# Patient Record
Sex: Male | Born: 1970 | Race: White | Hispanic: No | Marital: Married | Smoking: Former smoker
Health system: Southern US, Community
[De-identification: ages and names within clinical notes are randomized; demographics above are authoritative.]

## PROBLEM LIST (undated history)

## (undated) DIAGNOSIS — E785 Hyperlipidemia, unspecified: Secondary | ICD-10-CM

## (undated) DIAGNOSIS — R51 Headache: Secondary | ICD-10-CM

## (undated) DIAGNOSIS — K649 Unspecified hemorrhoids: Secondary | ICD-10-CM

## (undated) DIAGNOSIS — R7989 Other specified abnormal findings of blood chemistry: Secondary | ICD-10-CM

## (undated) DIAGNOSIS — R519 Headache, unspecified: Secondary | ICD-10-CM

## (undated) DIAGNOSIS — I10 Essential (primary) hypertension: Secondary | ICD-10-CM

## (undated) DIAGNOSIS — E039 Hypothyroidism, unspecified: Secondary | ICD-10-CM

## (undated) DIAGNOSIS — J189 Pneumonia, unspecified organism: Secondary | ICD-10-CM

## (undated) DIAGNOSIS — R011 Cardiac murmur, unspecified: Secondary | ICD-10-CM

## (undated) HISTORY — DX: Other specified abnormal findings of blood chemistry: R79.89

## (undated) HISTORY — DX: Hypothyroidism, unspecified: E03.9

## (undated) HISTORY — PX: OTHER SURGICAL HISTORY: SHX169

## (undated) HISTORY — PX: HERNIA REPAIR: SHX51

## (undated) HISTORY — DX: Hyperlipidemia, unspecified: E78.5

## (undated) HISTORY — PX: MOUTH SURGERY: SHX715

---

## 2015-01-04 ENCOUNTER — Ambulatory Visit: Payer: Self-pay | Admitting: Cardiovascular Disease

## 2015-01-24 NOTE — Progress Notes (Signed)
Patient ID: Tanner Harrison, male   DOB: February 12, 1971, 44 y.o.   MRN: [id number]    Cardiology Office Note   Date:  01/25/2015   ID:  Tanner Harrison, DOB 05/14/1971, MRN [id number]  PCP:  Antony Blackbird, MD  Cardiologist:   Jenkins Rouge, MD   No chief complaint on file.     History of Present Illness: Tanner Harrison is a 44 y.o. male who presents for evaluation family history of premature CAD. Father with MI in late 18's and mom with heart valve replacement in 60's   Referred by Tanner Harrison  He takes a baby aspirin daily. On thyroid replacement and low testosterone. Fatigue No chest pain palpitations.  Recent weight gain.  20lbs.  On lots of supplements and DHEA from a Dr Tanner Harrison in [place].  Sedentary since started at [place] year was able to run 1/2 marathon.  Has been on statin since age 81  Dose recently decreased from 20 mg to 5 mg concern about long term side effects.  Non smoker , no DM or HTN   Labs reviewed from 8/15  Normal LFTls K 4.6 Testosterone 1.85 LDL 85 HDL 44  TSH 1.55     No past medical history on file.  No past surgical history on file.   Current Outpatient Prescriptions  Medication Sig Dispense Refill  . albuterol (PROVENTIL HFA;VENTOLIN HFA) 108 (90 BASE) MCG/ACT inhaler Inhale 2 puffs into the lungs every 6 (six) hours as needed for wheezing or shortness of breath.    Marland Kitchen aspirin 81 MG tablet Take 81 mg by mouth daily.    . cholecalciferol (VITAMIN D) 1000 UNITS tablet Take 5,000 Units by mouth daily.    . fluticasone (FLONASE) 50 MCG/ACT nasal spray Place 2 sprays into both nostrils daily as needed. allergies  12  . levothyroxine (SYNTHROID, LEVOTHROID) 112 MCG tablet Take 112 mcg by mouth daily.  0  . montelukast (SINGULAIR) 10 MG tablet Take 10 mg by mouth daily.  1  . simvastatin (ZOCOR) 5 MG tablet Take 5 mg by mouth daily.  0   No current facility-administered medications for this visit.    Allergies:   Review of patient's allergies indicates no known  allergies.    Social History:  The patient  reports that he has quit smoking. He does not have any smokeless tobacco history on file. He reports that he does not drink alcohol or use illicit drugs.   Family History:  SEE HPI  Premature CAD    ROS:  Please see the history of present illness.   Otherwise, review of systems are positive for none.   All other systems are reviewed and negative.    PHYSICAL EXAM: VS:  BP 158/100 mmHg  Pulse 65  Ht 5\' 5"  (1.651 m)  Wt 102.114 kg (225 lb 1.9 oz)  BMI 37.46 kg/m2 , BMI Body mass index is 37.46 kg/(m^2). GEN: Well nourished, well developed, in no acute distress HEENT: normal Neck: no JVD, carotid bruits, or masses Cardiac: RRR; no murmurs, rubs, or gallops,no edema  Respiratory:  clear to auscultation bilaterally, normal work of breathing GI: soft, nontender, nondistended, + BS MS: no deformity or atrophy Skin: warm and dry, no rash Neuro:  Strength and sensation are intact Psych: euthymic mood, full affect   EKG:   01/25/15  NSR normal no change from ECG from [facility] done 6/13   Recent Labs: No results found for requested labs within last 365 days.    Lipid  Panel No results found for: CHOL, TRIG, HDL, CHOLHDL, VLDL, LDLCALC, LDLDIRECT    Wt Readings from Last 3 Encounters:  01/25/15 102.114 kg (225 lb 1.9 oz)      Other studies Reviewed: Additional studies/ records that were reviewed today include: [facility] / epic notes and lab work with old ECG .    ASSESSMENT AND PLAN:  1.  Family history of heart disease:  No symptoms normal ECG Does not need ETT.  Recommended coronary calcium score to further risk stratify and see what  Intensity of statin Rx may be needed.  Continue ASA.  F/U in a year 2. Hypothyroid:   TSH normal on replacement 3> Low T  Concern for side effects including hypercoagulability  ASA F/U Augoustides 4. Chol:  On low dose statin if calcium score 0 consider changing to qod  LFTls normal Chronic use may be  contributing to fatigue    Current medicines are reviewed at length with the patient today.  The patient has concerns regarding statin Addressed   The following changes have been made:  no change  Labs/ tests ordered today include: Coronary calcium Score  Orders Placed This Encounter  Procedures  . CT Cardiac Scoring  . EKG 12-Lead     Disposition:   FU with me  in 1 year   Signed, Jenkins Rouge, MD  01/25/2015 5:20 PM    [facility] [place], [place], [place]  [address] Phone: [phone]; Fax: [phone]

## 2015-01-25 ENCOUNTER — Ambulatory Visit (INDEPENDENT_AMBULATORY_CARE_PROVIDER_SITE_OTHER): Payer: BLUE CROSS/BLUE SHIELD | Admitting: Cardiovascular Disease

## 2015-01-25 ENCOUNTER — Encounter: Payer: Self-pay | Admitting: Cardiovascular Disease

## 2015-01-25 VITALS — BP 158/100 | HR 65 | Ht 65.0 in | Wt 225.1 lb

## 2015-01-25 DIAGNOSIS — Z8249 Family history of ischemic heart disease and other diseases of the circulatory system: Secondary | ICD-10-CM

## 2015-01-25 NOTE — Patient Instructions (Signed)
Your physician wants you to follow-up in:   [place] will receive a reminder letter in the mail two months in advance. If you don't receive a letter, please call our office to schedule the follow-up appointment. Your physician recommends that you continue on your current medications as directed. Please refer to the Current Medication list given to you today.    CALCIUM  SCORE   OUT OF POCKET   $150 .00

## 2015-03-09 ENCOUNTER — Ambulatory Visit (INDEPENDENT_AMBULATORY_CARE_PROVIDER_SITE_OTHER)
Admission: RE | Admit: 2015-03-09 | Discharge: 2015-03-09 | Disposition: A | Discharge status: Home / Self Care | Payer: Self-pay | Source: Ambulatory Visit | Attending: Cardiovascular Disease | Admitting: Cardiovascular Disease

## 2015-03-09 DIAGNOSIS — Z8249 Family history of ischemic heart disease and other diseases of the circulatory system: Secondary | ICD-10-CM

## 2016-02-12 DIAGNOSIS — E039 Hypothyroidism, unspecified: Secondary | ICD-10-CM | POA: Insufficient documentation

## 2016-02-12 DIAGNOSIS — R7989 Other specified abnormal findings of blood chemistry: Secondary | ICD-10-CM | POA: Insufficient documentation

## 2016-02-12 DIAGNOSIS — E785 Hyperlipidemia, unspecified: Secondary | ICD-10-CM | POA: Insufficient documentation

## 2016-02-12 NOTE — Progress Notes (Signed)
Patient ID: Tanner Harrison, male   DOB: Mar 17, 1971, 45 y.o.   MRN: [id number]    Cardiology Office Note   Date:  02/12/2016   ID:  Tanner Harrison, DOB 1971/12/03, MRN [id number]  PCP:  Antony Blackbird, MD  Cardiologist:   Jenkins Rouge, MD   No chief complaint on file.     History of Present Illness: Tanner Harrison is a 45 y.o. male who presents for evaluation family history of premature CAD. Father with MI in late 68's and mom with heart valve replacement in 60's   Referred by Cammie Fulpe  He takes a baby aspirin daily. On thyroid replacement and low testosterone. Fatigue No chest pain palpitations.  Recent weight gain.  20lbs.  On lots of supplements and DHEA from a Dr Judeen Hammans in [place].  Sedentary since started at [place] year was able to run 1/2 marathon.  Has been on statin since age 36  Dose recently decreased from 20 mg to 5 mg concern about long term side effects.  Non smoker , no DM or HTN   Labs reviewed from 8/15  Normal LFTls K 4.6 Testosterone 1.85 LDL 85 HDL 44  TSH 1.55   03/09/15  Calcium score was 0    Past Medical History  Diagnosis Date  . Hypothyroid   . Low TSH level   . Hyperlipidemia     No past surgical history on file.   Current Outpatient Prescriptions  Medication Sig Dispense Refill  . albuterol (PROVENTIL HFA;VENTOLIN HFA) 108 (90 BASE) MCG/ACT inhaler Inhale 2 puffs into the lungs every 6 (six) hours as needed for wheezing or shortness of breath.    Marland Kitchen aspirin 81 MG tablet Take 81 mg by mouth daily.    . cholecalciferol (VITAMIN D) 1000 UNITS tablet Take 5,000 Units by mouth daily.    . fluticasone (FLONASE) 50 MCG/ACT nasal spray Place 2 sprays into both nostrils daily as needed. allergies  12  . levothyroxine (SYNTHROID, LEVOTHROID) 112 MCG tablet Take 112 mcg by mouth daily.  0  . montelukast (SINGULAIR) 10 MG tablet Take 10 mg by mouth daily.  1  . simvastatin (ZOCOR) 5 MG tablet Take 5 mg by mouth daily.  0   No current facility-administered  medications for this visit.    Allergies:   Review of patient's allergies indicates no known allergies.    Social History:  The patient  reports that he has quit smoking. He does not have any smokeless tobacco history on file. He reports that he does not drink alcohol or use illicit drugs.   Family History:  SEE HPI  Premature CAD    ROS:  Please see the history of present illness.   Otherwise, review of systems are positive for none.   All other systems are reviewed and negative.    PHYSICAL EXAM: VS:  There were no vitals taken for this visit. , BMI There is no weight on file to calculate BMI. GEN: Well nourished, well developed, in no acute distress HEENT: normal Neck: no JVD, carotid bruits, or masses Cardiac: RRR; no murmurs, rubs, or gallops,no edema  Respiratory:  clear to auscultation bilaterally, normal work of breathing GI: soft, nontender, nondistended, + BS MS: no deformity or atrophy Skin: warm and dry, no rash Neuro:  Strength and sensation are intact Psych: euthymic mood, full affect   EKG:   01/25/15  NSR normal no change from ECG from [facility] done 6/13   Recent Labs: No results found for  requested labs within last 365 days.    Lipid Panel No results found for: CHOL, TRIG, HDL, CHOLHDL, VLDL, LDLCALC, LDLDIRECT    Wt Readings from Last 3 Encounters:  01/25/15 102.114 kg (225 lb 1.9 oz)      Other studies Reviewed: Additional studies/ records that were reviewed today include: [facility] / epic notes and lab work with old ECG .    ASSESSMENT AND PLAN:  1.  Family history of heart disease:  No symptoms normal ECG Does not need ETT.  Recommended coronary calcium score to further risk stratify and see what  Intensity of statin Rx may be needed.  Continue ASA.  F/U in a year 2. Hypothyroid:   TSH normal on replacement 3> Low T  Concern for side effects including hypercoagulability  ASA F/U Augoustides 4. Chol:  On low dose statin calcium score 0    Current  medicines are reviewed at length with the patient today.  The patient has concerns regarding statin Addressed   The following changes have been made:  no change  Labs/ tests ordered today include: Coronary calcium Score  No orders of the defined types were placed in this encounter.     Disposition:   FU with me  in 1 year   Signed, Jenkins Rouge, MD  02/12/2016 4:24 PM    [facility] [place], [place], [place]  [address] Phone: [phone]; Fax: [phone]

## 2016-02-19 NOTE — Progress Notes (Signed)
Cardiology Office Note:    Date:  02/20/2016   ID:  Tanner Harrison, DOB 1971/07/23, MRN [id number]  PCP:  Karle Starch, MD  Cardiologist:  Dr. Jenkins Rouge   Electrophysiologist:  n/a  Chief Complaint  Patient presents with  . FHx of CAD    History of Present Illness:     Tanner Harrison is a 45 y.o. male with a hx of family history of premature CAD (father with MI late 81s, mom with heart valve replacement in her 109s), hypothyroidism, dyslipidemia. He was evaluated by Dr. Johnsie Cancel in 2/16. Cardiac CT demonstrated calcium score 0.   Returns for FU. Doing well. He tries to run 1-2 times a week.  PCP told him his "salt was low."  He has added salt to his diet. His BP is high today.  His BP at other MD visits has been 120/80s.  He denies chest pain, dyspnea, syncope, orthopnea, PND, edema.  He snores.  His wife has not noticed any apnea.  He denies daytime hypersomnolence.     Past Medical History  Diagnosis Date  . Hypothyroid   . Low TSH level   . Hyperlipidemia     No past surgical history on file.  Current Medications: Outpatient Prescriptions Prior to Visit  Medication Sig Dispense Refill  . albuterol (PROVENTIL HFA;VENTOLIN HFA) 108 (90 BASE) MCG/ACT inhaler Inhale 2 puffs into the lungs every 6 (six) hours as needed for wheezing or shortness of breath.    Marland Kitchen aspirin 81 MG tablet Take 81 mg by mouth daily.    . cholecalciferol (VITAMIN D) 1000 UNITS tablet Take 1,000 Units by mouth daily.     . fluticasone (FLONASE) 50 MCG/ACT nasal spray Place 2 sprays into both nostrils daily as needed. allergies  12  . levothyroxine (SYNTHROID, LEVOTHROID) 112 MCG tablet Take 112 mcg by mouth daily.  0  . montelukast (SINGULAIR) 10 MG tablet Take 10 mg by mouth daily as needed (FOR ALLERGIES).   1  . simvastatin (ZOCOR) 5 MG tablet TAKE 10 MG TABLET BY MOUTH EVERY OTHER DAY  0   No facility-administered medications prior to visit.     Allergies:   Review of patient's allergies  indicates no known allergies.   Social History   Social History  . Marital Status: Married    Spouse Name: N/A  . Number of Children: N/A  . Years of Education: N/A   Occupational History  . 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 Smoking status: Former Research scientist (life sciences)  . Smokeless tobacco: None  . Alcohol Use: No  . Drug Use: No  . Sexual Activity: Not Asked   Other Topics Concern  . None   Social History Narrative   Tax Administrator with [employer]   Married   4 children - 3 living; Daughter married and lives in [place], [place]        Family History:  The patient's family history includes Heart Problems (age of onset: 55) in his mother; Heart attack (age of onset: 61) in his father.   ROS:   Please see the history of present illness.    Review of Systems  Respiratory: Positive for snoring.    All other systems reviewed and are negative.   Physical Exam:    VS:  BP 140/100 mmHg  Pulse 54  Ht 5\' 5"  (1.651 m)  Wt 226 lb 12.8 oz (102.876 kg)  BMI 37.74 kg/m2   GEN: Well nourished, well developed,  in no acute distress HEENT: normal Neck: no JVD, no masses Cardiac: Normal S1/S2, RRR; no murmurs,  no edema    Respiratory:  clear to auscultation bilaterally; no wheezing, rhonchi or rales GI: soft, nontender MS: no deformity or atrophy Skin: warm and dry  Neuro:  no focal deficits  Psych: Alert and oriented x 3, normal affect  Wt Readings from Last 3 Encounters:  02/20/16 226 lb 12.8 oz (102.876 kg)  01/25/15 225 lb 1.9 oz (102.114 kg)      Studies/Labs Reviewed:     EKG:  EKG is  ordered today.  The ekg ordered today demonstrates Sinus brady, HR 54, normal axis, no ST changes, QTc 394 ms, no changes  Recent Labs: No results found for requested labs within last 365 days.  Labs 11/14/15 (from PCP): K 4.5, creatinine 0.93, ALT 26, TC 179, HDL 45, LDL 110, Hgb 16.2, TSH 1.617  Recent Lipid Panel No results found for: CHOL, TRIG, HDL,  CHOLHDL, VLDL, LDLCALC, LDLDIRECT  Additional studies/ records that were reviewed today include:   Cardiac CT 3/16 IMPRESSION: Coronary calcium score of 0.   ASSESSMENT:     1. Elevated blood pressure   2. Family history of premature CAD   3. Dyslipidemia     PLAN:     In order of problems listed above:  1. Elevated BP - BP is quite high.  He notes optimal BPs at other times. He has increased his salt recently.  We discussed TLC, weight loss, exercise.  I have asked him to check his BP x 2 weeks and send them to me.  If consistently elevated, consider adding medication (thiazide vs ACE) with earlier FU.    2. FHx of CAD - Ca score 0.  No angina.  Continue ASA, statin. PCP has requested a copy of Cardiac CT - will fax copy of report.    3. Dyslipidemia - ALT 26, TC 179, TG 120, HDL 45, LDL 110 by labs with PCP 11/14/15.  Continue statin.    Medication Adjustments/Labs and Tests Ordered: Current medicines are reviewed at length with the patient today.  Concerns regarding medicines are outlined above.  Medication changes, Labs and Tests ordered today are outlined in the Patient Instructions noted below. Patient Instructions  Medication Instructions:  No changes.  See your medication list.  Labwork: None today.   Testing/Procedures: None.   Follow-Up: Dr. Jenkins Rouge in 1 year.  Any Other Special Instructions Will Be Listed Below (If Applicable). Check your blood pressure daily for 2 weeks and send a copy of the blood pressure readings to [employer], PA-C.  If you need a refill on your cardiac medications before your next appointment, please call your pharmacy.    Signed, Richardson Dopp, PA-C  02/20/2016 9:32 AM    [facility] [place], [place], [place]  [address] Phone: [phone]; Fax: [phone]

## 2016-02-20 ENCOUNTER — Encounter: Payer: BLUE CROSS/BLUE SHIELD | Admitting: Cardiovascular Disease

## 2016-02-20 ENCOUNTER — Ambulatory Visit (INDEPENDENT_AMBULATORY_CARE_PROVIDER_SITE_OTHER): Payer: Federal, State, Local not specified - PPO | Admitting: Physician Assistant

## 2016-02-20 ENCOUNTER — Encounter: Payer: Self-pay | Admitting: Physician Assistant

## 2016-02-20 VITALS — BP 140/100 | HR 54 | Ht 65.0 in | Wt 226.8 lb

## 2016-02-20 DIAGNOSIS — Z8249 Family history of ischemic heart disease and other diseases of the circulatory system: Secondary | ICD-10-CM | POA: Diagnosis not present

## 2016-02-20 DIAGNOSIS — R030 Elevated blood-pressure reading, without diagnosis of hypertension: Secondary | ICD-10-CM | POA: Diagnosis not present

## 2016-02-20 DIAGNOSIS — E785 Hyperlipidemia, unspecified: Secondary | ICD-10-CM

## 2016-02-20 DIAGNOSIS — IMO0001 Reserved for inherently not codable concepts without codable children: Secondary | ICD-10-CM

## 2016-02-20 NOTE — Patient Instructions (Addendum)
Medication Instructions:  No changes.  See your medication list.  Labwork: None today.   Testing/Procedures: None.   Follow-Up: Dr. Jenkins Rouge in 1 year.  Any Other Special Instructions Will Be Listed Below (If Applicable). Check your blood pressure daily for 2 weeks and send a copy of the blood pressure readings to Richardson Dopp, PA-C.  DASH Eating Plan DASH stands for "Dietary Approaches to Stop Hypertension." The DASH eating plan is a healthy eating plan that has been shown to reduce high blood pressure (hypertension). Additional health benefits may include reducing the risk of type 2 diabetes mellitus, heart disease, and stroke. The DASH eating plan may also help with weight loss. WHAT DO I NEED TO KNOW ABOUT THE DASH EATING PLAN? For the DASH eating plan, you will follow these general guidelines:  Choose foods with a percent daily value for sodium of less than 5% (as listed on the food label).  Use salt-free seasonings or herbs instead of table salt or sea salt.  Check with your health care provider or pharmacist before using salt substitutes.  Eat lower-sodium products, often labeled as "lower sodium" or "no salt added."  Eat fresh foods.  Eat more vegetables, fruits, and low-fat dairy products.  Choose whole grains. Look for the word "whole" as the first word in the ingredient list.  Choose fish and skinless chicken or [place] more often than red meat. Limit fish, poultry, and meat to 6 oz (170 g) each day.  Limit sweets, desserts, sugars, and sugary drinks.  Choose heart-healthy fats.  Limit cheese to 1 oz (28 g) per day.  Eat more home-cooked food and less restaurant, buffet, and fast food.  Limit fried foods.  Cook foods using methods other than frying.  Limit canned vegetables. If you do use them, rinse them well to decrease the sodium.  When eating at a restaurant, ask that your food be prepared with less salt, or no salt if possible. WHAT FOODS CAN I  EAT? Seek help from a dietitian for individual calorie needs. Grains Whole grain or whole wheat bread. Brown rice. Whole grain or whole wheat pasta. Quinoa, bulgur, and whole grain cereals. Low-sodium cereals. Corn or whole wheat flour tortillas. Whole grain cornbread. Whole grain crackers. Low-sodium crackers. Vegetables Fresh or frozen vegetables (raw, steamed, roasted, or grilled). Low-sodium or reduced-sodium tomato and vegetable juices. Low-sodium or reduced-sodium tomato sauce and paste. Low-sodium or reduced-sodium canned vegetables.  Fruits All fresh, canned (in natural juice), or frozen fruits. Meat and Other Protein Products Ground beef (85% or leaner), grass-fed beef, or beef trimmed of fat. Skinless chicken or [place]. Ground chicken or [place]. Pork trimmed of fat. All fish and seafood. Eggs. Dried beans, peas, or lentils. Unsalted nuts and seeds. Unsalted canned beans. Dairy Low-fat dairy products, such as skim or 1% milk, 2% or reduced-fat cheeses, low-fat ricotta or cottage cheese, or plain low-fat yogurt. Low-sodium or reduced-sodium cheeses. Fats and Oils Tub margarines without trans fats. Light or reduced-fat mayonnaise and salad dressings (reduced sodium). Avocado. Safflower, olive, or canola oils. Natural peanut or almond butter. Other Unsalted popcorn and pretzels. The items listed above may not be a complete list of recommended foods or beverages. Contact your dietitian for more options. WHAT FOODS ARE NOT RECOMMENDED? Grains White bread. White pasta. White rice. Refined cornbread. Bagels and croissants. Crackers that contain trans fat. Vegetables Creamed or fried vegetables. Vegetables in a cheese sauce. Regular canned vegetables. Regular canned tomato sauce and paste. Regular tomato and vegetable juices.  Fruits Dried fruits. Canned fruit in light or heavy syrup. Fruit juice. Meat and Other Protein Products Fatty cuts of meat. Ribs, chicken wings, bacon, sausage,  bologna, salami, chitterlings, fatback, hot dogs, bratwurst, and packaged luncheon meats. Salted nuts and seeds. Canned beans with salt. Dairy Whole or 2% milk, cream, half-and-half, and cream cheese. Whole-fat or sweetened yogurt. Full-fat cheeses or blue cheese. Nondairy creamers and whipped toppings. Processed cheese, cheese spreads, or cheese curds. Condiments Onion and garlic salt, seasoned salt, table salt, and sea salt. Canned and packaged gravies. Worcestershire sauce. Tartar sauce. Barbecue sauce. Teriyaki sauce. Soy sauce, including reduced sodium. Steak sauce. Fish sauce. Oyster sauce. Cocktail sauce. Horseradish. Ketchup and mustard. Meat flavorings and tenderizers. Bouillon cubes. Hot sauce. Tabasco sauce. Marinades. Taco seasonings. Relishes. Fats and Oils Butter, stick margarine, lard, shortening, ghee, and bacon fat. Coconut, palm kernel, or palm oils. Regular salad dressings. Other Pickles and olives. Salted popcorn and pretzels. The items listed above may not be a complete list of foods and beverages to avoid. Contact your dietitian for more information. WHERE CAN I FIND MORE INFORMATION? National Heart, Lung, and Blood Institute: travelstabloid.com   This information is not intended to replace advice given to you by your health care provider. Make sure you discuss any questions you have with your health care provider.   Document Released: 11/27/2011 Document Revised: 12/29/2014 Document Reviewed: 10/12/2013 Elsevier Interactive Patient Education [employer].   If you need a refill on your cardiac medications before your next appointment, please call your pharmacy.

## 2016-02-26 ENCOUNTER — Encounter: Payer: Self-pay | Admitting: Physician Assistant

## 2016-02-29 ENCOUNTER — Encounter: Payer: Self-pay | Admitting: Physician Assistant

## 2016-03-05 ENCOUNTER — Encounter: Payer: Self-pay | Admitting: Physician Assistant

## 2016-03-06 ENCOUNTER — Telehealth: Payer: Self-pay | Admitting: Physician Assistant

## 2016-03-06 DIAGNOSIS — I10 Essential (primary) hypertension: Secondary | ICD-10-CM

## 2016-03-06 MED ORDER — LISINOPRIL-HYDROCHLOROTHIAZIDE 10-12.5 MG PO TABS: 1.0000 | ORAL_TABLET | Freq: Every day | ORAL | Status: DC

## 2016-03-06 NOTE — Telephone Encounter (Signed)
Please start Lisinopril/HCTZ 10/12.5 mg QD. Arrange BMET 1 week. Rx sent to [facility] in [place] and order for BMET put in chart. I have sent email to patient to let him know we are starting medication. Arrange FU in 4-6 weeks. Track BPs and bring to appointment. Richardson Dopp, PA-C   03/06/2016 5:22 PM

## 2016-03-07 ENCOUNTER — Telehealth: Payer: Self-pay | Admitting: Physician Assistant

## 2016-03-07 DIAGNOSIS — I10 Essential (primary) hypertension: Secondary | ICD-10-CM

## 2016-03-07 MED ORDER — AMLODIPINE BESYLATE 5 MG PO TABS: 5.0000 mg | ORAL_TABLET | Freq: Every day | ORAL | Status: DC

## 2016-03-07 NOTE — Telephone Encounter (Signed)
Patient did not want to take Lisinopril. I sent in Norvasc 5 mg QD Cancel FU labs. This is what I sent patient: You can try Norvasc (Amlodipine) 5 mg Once daily instead. I will send this to your pharmacy. You do not need blood work after starting Norvasc. Richardson Dopp, PA-C   03/07/2016 2:26 PM

## 2016-05-01 ENCOUNTER — Emergency Department (INDEPENDENT_AMBULATORY_CARE_PROVIDER_SITE_OTHER): Payer: Federal, State, Local not specified - PPO

## 2016-05-01 ENCOUNTER — Encounter: Payer: Self-pay | Admitting: Emergency Medicine

## 2016-05-01 ENCOUNTER — Emergency Department
Admission: EM | Admit: 2016-05-01 | Discharge: 2016-05-01 | Disposition: A | Discharge status: Home / Self Care | Payer: Federal, State, Local not specified - PPO | Source: Home / Self Care | Attending: Family Medicine | Admitting: Family Medicine

## 2016-05-01 DIAGNOSIS — S60222A Contusion of left hand, initial encounter: Secondary | ICD-10-CM

## 2016-05-01 DIAGNOSIS — M25532 Pain in left wrist: Secondary | ICD-10-CM

## 2016-05-01 DIAGNOSIS — M79642 Pain in left hand: Secondary | ICD-10-CM

## 2016-05-01 NOTE — ED Notes (Signed)
Left hand, wrist, forearm injury from a fall x 3 days ago

## 2016-05-01 NOTE — Discharge Instructions (Signed)
Hand Contusion  A hand contusion is a deep bruise on your hand area. Contusions are the result of an injury that caused bleeding under the skin. The contusion may turn blue, purple, or yellow. Minor injuries will give you a painless contusion, but more severe contusions may stay painful and swollen for a few weeks.  CAUSES   A contusion is usually caused by a blow, trauma, or direct force to an area of the body.  SYMPTOMS    Swelling and redness of the injured area.   Discoloration of the injured area.   Tenderness and soreness of the injured area.   Pain.  DIAGNOSIS   The diagnosis can be made by taking a history and performing a physical exam. An X-ray, CT scan, or MRI may be needed to determine if there were any associated injuries, such as broken bones (fractures).  TREATMENT   Often, the best treatment for a hand contusion is resting, elevating, icing, and applying cold compresses to the injured area. Over-the-counter medicines may also be recommended for pain control.  HOME CARE INSTRUCTIONS    Put ice on the injured area.    Put ice in a plastic bag.    Place a towel between your skin and the bag.    Leave the ice on for 15-20 minutes, 03-04 times a day.   Only take over-the-counter or prescription medicines as directed by your caregiver. Your caregiver may recommend avoiding anti-inflammatory medicines (aspirin, ibuprofen, and naproxen) for 48 hours because these medicines may increase bruising.   If told, use an elastic wrap as directed. This can help reduce swelling. You may remove the wrap for sleeping, showering, and bathing. If your fingers become numb, cold, or blue, take the wrap off and reapply it more loosely.   Elevate your hand with pillows to reduce swelling.   Avoid overusing your hand if it is painful.  SEEK IMMEDIATE MEDICAL CARE IF:    You have increased redness, swelling, or pain in your hand.   Your swelling or pain is not relieved with medicines.   You have loss of feeling in  your hand or are unable to move your fingers.   Your hand turns cold or blue.   You have pain when you move your fingers.   Your hand becomes warm to the touch.   Your contusion does not improve in 2 days.  MAKE SURE YOU:    Understand these instructions.   Will watch your condition.   Will get help right away if you are not doing well or get worse.     This information is not intended to replace advice given to you by your health care provider. Make sure you discuss any questions you have with your health care provider.     Document Released: 05/30/2002 Document Revised: 09/01/2012 Document Reviewed: 05/31/2012  Elsevier Interactive Patient Education 2016 Elsevier Inc.

## 2016-05-01 NOTE — ED Provider Notes (Signed)
CSN: [id number]     Arrival date & time 05/01/16  1315 History   First MD Initiated Contact with Patient 05/01/16 1323     Chief Complaint  Patient presents with  . Fall   (Consider location/radiation/quality/duration/timing/severity/associated sxs/prior Treatment) HPI  The pt is a 45yo male presenting to [facility] with c/o Left hand and wrist pain, mild aching with intermittent sharp shooting pain in his forearm earlier today.  Symptoms started 2 days ago after he fell off a skateboard and landed on both hands in front of him, palms down.  He notes some bruising to his Left palm. Pt is Right hand dominant. He has not taken anything for pain. Denies any other injuries.   Past Medical History  Diagnosis Date  . Hypothyroid   . Low TSH level   . Hyperlipidemia    History reviewed. No pertinent past surgical history. Family History  Problem Relation Age of Onset  . Heart attack Father 56  . Heart Problems Mother 62    HEART VALUE REPLACEMENT   Social History  Substance Use Topics  . Smoking status: Former Research scientist (life sciences)  . Smokeless tobacco: None  . Alcohol Use: No    Review of Systems  Musculoskeletal: Positive for myalgias and arthralgias. Negative for joint swelling.       Left hand and wrist  Skin: Positive for color change. Negative for wound.  Neurological: Negative for weakness and numbness.    Allergies  Review of patient's allergies indicates no known allergies.  Home Medications   Prior to Admission medications   Medication Sig Start Date End Date Taking? Authorizing Provider  albuterol (PROVENTIL HFA;VENTOLIN HFA) 108 (90 BASE) MCG/ACT inhaler Inhale 2 puffs into the lungs every 6 (six) hours as needed for wheezing or shortness of breath.    Historical Provider, MD  amLODipine (NORVASC) 5 MG tablet Take 1 tablet (5 mg total) by mouth daily. 03/07/16   Liliane Shi, PA-C  aspirin 81 MG tablet Take 81 mg by mouth daily.    Historical Provider, MD  cholecalciferol (VITAMIN D)  1000 UNITS tablet Take 1,000 Units by mouth daily.     Historical Provider, MD  fluticasone (FLONASE) 50 MCG/ACT nasal spray Place 2 sprays into both nostrils daily as needed. allergies 12/24/14   Historical Provider, MD  levothyroxine (SYNTHROID, LEVOTHROID) 112 MCG tablet Take 112 mcg by mouth daily. 12/08/14   Historical Provider, MD  montelukast (SINGULAIR) 10 MG tablet Take 10 mg by mouth daily as needed (FOR ALLERGIES).  11/20/14   Historical Provider, MD  simvastatin (ZOCOR) 5 MG tablet TAKE 10 MG TABLET BY MOUTH EVERY OTHER DAY 01/02/15   Historical Provider, MD   Meds Ordered and Administered this Visit  Medications - No data to display  BP 135/87 mmHg  Pulse 75  Temp(Src) 98.3 F (36.8 C) (Oral)  Ht 5\' 5"  (1.651 m)  Wt 213 lb (96.616 kg)  BMI 35.44 kg/m2  SpO2 99% No data found.   Physical Exam  Constitutional: He is oriented to person, place, and time. He appears well-developed and well-nourished.  HENT:  Head: Normocephalic and atraumatic.  Eyes: EOM are normal.  Neck: Normal range of motion.  Cardiovascular: Normal rate.   Pulses:      Radial pulses are 2+ on the left side.  Left hand: cap refill < 3 seconds  Pulmonary/Chest: Effort normal.  Musculoskeletal: Normal range of motion. He exhibits tenderness. He exhibits no edema.  Left hand and wrist: no deformity or edema.  Mild tenderness to volar radial aspect of wrist and over thenar muscle.  Full ROM elbow, wrist, and fingers. 5/5 grip strength No snuffbox tenderness.  Neurological: He is alert and oriented to person, place, and time.  Left hand: normal sensation  Skin: Skin is warm and dry.  Left hand, palm aspect: skin in tact, mild ecchymosis over thenar muscle.   Psychiatric: He has a normal mood and affect. His behavior is normal.  Nursing note and vitals reviewed.   ED Course  Procedures (including critical care time)  Labs Review Labs Reviewed - No data to display  Imaging Review Dg Wrist Complete  Left  05/01/2016  CLINICAL DATA:  Pain following fall from skateboard 2 days prior EXAM: LEFT WRIST - COMPLETE 3+ VIEW COMPARISON:  None. FINDINGS: Frontal, oblique, lateral, and ulnar deviation scaphoid images were obtained. There is no fracture or dislocation. The joint spaces appear normal. No erosive change. IMPRESSION: No fracture or dislocation.  No appreciable arthropathy. Electronically Signed   By: Lowella Grip III M.D.   On: 05/01/2016 14:04   Dg Hand Complete Left  05/01/2016  CLINICAL DATA:  Pain following fall from skateboard 2 days prior EXAM: LEFT HAND - COMPLETE 3+ VIEW COMPARISON:  None. FINDINGS: Frontal, oblique, and lateral views were obtained. There is no fracture or dislocation. The joint spaces appear normal. No erosive change. IMPRESSION: No fracture or dislocation.  No appreciable arthropathy. Electronically Signed   By: Lowella Grip III M.D.   On: 05/01/2016 14:05      MDM   1. Left wrist pain   2. Hand contusion, left, initial encounter    Left hand and wrist pain. PMS in tact. Skin in tact Plain films: negative for fracture or dislocation  Reassured pt of normal imaging. Likely just a bruise and mild sprain.  Offered wrist splint, pt declined. Discussed rest, ice, compression and elevation May have acetaminophen and ibuprofen for pain. F/u with Sports Medicine in 1-2 weeks if not improving, sooner if worsening. Patient verbalized understanding and agreement with treatment plan.Noland Fordyce, PA-C 05/01/16 1425

## 2016-07-08 ENCOUNTER — Encounter: Payer: Self-pay | Admitting: *Deleted

## 2016-07-08 ENCOUNTER — Telehealth: Payer: Self-pay | Admitting: Physician Assistant

## 2016-07-08 ENCOUNTER — Encounter: Payer: Self-pay | Admitting: Physician Assistant

## 2016-07-08 DIAGNOSIS — I10 Essential (primary) hypertension: Secondary | ICD-10-CM | POA: Insufficient documentation

## 2016-07-08 MED ORDER — MAXZIDE-25 37.5-25 MG PO TABS: 1.0000 | ORAL_TABLET | Freq: Every day | ORAL | Status: DC

## 2016-07-08 NOTE — Telephone Encounter (Signed)
I have communicated with [place] by MyChart. He is having unwanted side effects of Amlodipine. DC Amlodipine  Start Maxzide 1 QD Rx sent into [facility] in [place] on [address]. He needs a BMET in 1 week - order placed in system Monitor BP x 2 weeks after starting Maxzide and send readings to me for review. Richardson Dopp, PA-C   07/08/2016 5:12 PM

## 2016-07-08 NOTE — Telephone Encounter (Signed)
Message sent to pt via [place] in regards to med change and lab work appt

## 2016-07-17 ENCOUNTER — Other Ambulatory Visit: Payer: Federal, State, Local not specified - PPO | Admitting: *Deleted

## 2016-11-11 ENCOUNTER — Telehealth: Payer: Self-pay | Admitting: Physician Assistant

## 2016-11-11 NOTE — Telephone Encounter (Signed)
New message  Pharm is calling to ask if medication can be converted to generic due to cost  Maxide 25 37.5-25mg  1 tablet daily

## 2016-11-11 NOTE — Telephone Encounter (Signed)
Called pharmacy and spoke with Carleene Overlie, to inform him that the pt requested name brand only and that he would have to speak with the pt about this. I advised the pharmacist that if he has any other problems, questions or concerns to call the office. Pharmacist verbalized understanding.

## 2016-11-14 ENCOUNTER — Other Ambulatory Visit: Payer: Self-pay | Admitting: Physician Assistant

## 2016-11-14 DIAGNOSIS — I10 Essential (primary) hypertension: Secondary | ICD-10-CM

## 2017-02-25 NOTE — Progress Notes (Deleted)
Cardiology Office Note:    Date:  02/25/2017   ID:  [place], DOB 10/15/1971, MRN [id number]  PCP:  Karle Starch, MD  Cardiologist:  Dr. Jenkins Rouge   Electrophysiologist:  [place]  No chief complaint on file.   History of Present Illness:     [place] is a 46 y.o. male with a hx of family history of premature CAD (father with MI late 48s, mom with heart valve replacement in her 63s), hypothyroidism, dyslipidemia. He was evaluated by me  in 2/16. Cardiac CT demonstrated calcium score 0.   Returns for FU. Doing well. He tries to run 1-2 times a week.  He denies chest pain, dyspnea, syncope, orthopnea, PND, edema.  He snores.  His wife has not noticed any apnea.  He denies daytime hypersomnolence.     Past Medical History:  Diagnosis Date  . Hyperlipidemia   . Hypothyroid   . Low TSH level     No past surgical history on file.  Current Medications: Outpatient Medications Prior to Visit  Medication Sig Dispense Refill  . albuterol (PROVENTIL HFA;VENTOLIN HFA) 108 (90 BASE) MCG/ACT inhaler Inhale 2 puffs into the lungs every 6 (six) hours as needed for wheezing or shortness of breath.    Marland Kitchen aspirin 81 MG tablet Take 81 mg by mouth daily.    . cholecalciferol (VITAMIN D) 1000 UNITS tablet Take 1,000 Units by mouth daily.     . fluticasone (FLONASE) 50 MCG/ACT nasal spray Place 2 sprays into both nostrils daily as needed. allergies  12  . levothyroxine (SYNTHROID, LEVOTHROID) 112 MCG tablet Take 112 mcg by mouth daily.  0  . montelukast (SINGULAIR) 10 MG tablet Take 10 mg by mouth daily as needed (FOR ALLERGIES).   1  . simvastatin (ZOCOR) 5 MG tablet TAKE 10 MG TABLET BY MOUTH EVERY OTHER DAY  0  . triamterene-hydrochlorothiazide (MAXZIDE-25) 37.5-25 MG tablet TAKE 1 TABLET BY MOUTH EVERY DAY 90 tablet 1   No facility-administered medications prior to visit.      Allergies:   Patient has no known allergies.   Social History   Social History  . Marital status:  Married    Spouse name: [place]  . Number of children: [place]  . Years of education: [place]   Occupational History  . 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 Smoking status: Former Research scientist (life sciences)  . Smokeless tobacco: Not on file  . Alcohol use No  . Drug use: No  . Sexual activity: Not on file   Other Topics Concern  . 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   ROS:   Please see the history of present illness.    Review of Systems  Respiratory: Positive for snoring.    All other systems reviewed and are negative.   Physical Exam:    VS:  There were no vitals taken for this visit.   GEN: Well nourished, well developed, in no acute distress  HEENT: normal  Neck: no JVD, no masses Cardiac: Normal S1/S2, RRR; no murmurs,  no edema    Respiratory:  clear to auscultation bilaterally; no wheezing, rhonchi  or rales GI: soft, nontender MS: no deformity or atrophy  Skin: warm and dry  Neuro:  no focal deficits  Psych: Alert and oriented x 3, normal affect  Wt Readings from Last 3 Encounters:  05/01/16 213 lb (96.6 kg)  02/20/16 226 lb 12.8 oz (102.9 kg)  01/25/15 225 lb 1.9 oz (102.1 kg)      Studies/Labs Reviewed:     EKG:  EKG is  ordered today.  The ekg ordered today demonstrates Sinus brady, HR 54, normal axis, no ST changes, QTc 394 ms, no changes  Recent Labs: No results found for requested labs within last 8760 hours.  Labs 11/14/15 (from PCP): K 4.5, creatinine 0.93, ALT 26, TC 179, HDL 45, LDL 110, Hgb 16.2, TSH 1.617  Recent Lipid Panel No results found for: CHOL, TRIG, HDL, CHOLHDL, VLDL, LDLCALC, LDLDIRECT  Additional studies/ records that were reviewed today include:    Cardiac CT 3/16 IMPRESSION: Coronary calcium score of 0.   ASSESSMENT:     No diagnosis found.  PLAN:     In order of problems listed above:  1. Elevated BP -    2. FHx of CAD - Ca score 0.  No angina.  Continue ASA, statin.     3. Dyslipidemia - ALT 26, TC 179, TG 120, HDL 45, LDL 110 by labs with PCP 11/14/15.  Continue statin.   4. Thyroid: on replacement labs with primary    Jenkins Rouge

## 2017-02-26 ENCOUNTER — Ambulatory Visit: Payer: Federal, State, Local not specified - PPO | Admitting: Cardiovascular Disease

## 2017-03-09 NOTE — Progress Notes (Signed)
Cardiology Office Note:    Date:  03/12/2017   ID:  [place], DOB Jul 11, 1971, MRN [id number]  PCP:  Karle Starch, MD  Cardiologist:  Dr. Jenkins Rouge   Electrophysiologist:  n/a  No chief complaint on file.   History of Present Illness:     [place] is a 46 y.o. male with a hx of family history of premature CAD (father with MI late 50s, mom with heart valve replacement in her 87s), hypothyroidism, dyslipidemia. He was evaluated by me  in 2/16. Cardiac CT demonstrated calcium score 0.   Returns for FU. Doing well. He tries to run 1-2 times a week.  He denies chest pain, dyspnea, syncope, orthopnea, PND, edema.  He snores.  His wife has not noticed any apnea.  He denies daytime hypersomnolence.     Past Medical History:  Diagnosis Date  . Hyperlipidemia   . Hypothyroid   . Low TSH level     No past surgical history on file.  Current Medications: Outpatient Medications Prior to Visit  Medication Sig Dispense Refill  . albuterol (PROVENTIL HFA;VENTOLIN HFA) 108 (90 BASE) MCG/ACT inhaler Inhale 2 puffs into the lungs every 6 (six) hours as needed for wheezing or shortness of breath.    Marland Kitchen aspirin 81 MG tablet Take 81 mg by mouth daily.    . fluticasone (FLONASE) 50 MCG/ACT nasal spray Place 2 sprays into both nostrils daily as needed. allergies  12  . levothyroxine (SYNTHROID, LEVOTHROID) 112 MCG tablet Take 112 mcg by mouth daily.  0  . montelukast (SINGULAIR) 10 MG tablet Take 10 mg by mouth daily as needed (FOR ALLERGIES).   1  . simvastatin (ZOCOR) 5 MG tablet TAKE 10 MG TABLET BY MOUTH EVERY OTHER DAY  0  . triamterene-hydrochlorothiazide (MAXZIDE-25) 37.5-25 MG tablet TAKE 1 TABLET BY MOUTH EVERY DAY 90 tablet 1  . cholecalciferol (VITAMIN D) 1000 UNITS tablet Take 1,000 Units by mouth daily.      No facility-administered medications prior to visit.      Allergies:   Patient has no known allergies.   Social History   Social History  . Marital status:  Married    Spouse name: N/A  . Number of children: N/A  . Years of education: N/A   Occupational History  . 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 Smoking status: Former Research scientist (life sciences)  . Smokeless tobacco: Never Used  . Alcohol use No  . Drug use: No  . Sexual activity: Not on file   Other Topics Concern  . 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   ROS:   Please see the history of present illness.    Review of Systems  Respiratory: Positive for snoring.    All other systems reviewed and are negative.   Physical Exam:    VS:  BP 122/70   Pulse 66   Ht 5\' 5"  (1.651 m)   Wt 223 lb 8 oz (101.4 kg)   SpO2 97%   BMI 37.19 kg/m    GEN: Well nourished, well developed, in no acute distress  HEENT: normal  Neck: no JVD,  no masses Cardiac: Normal S1/S2, RRR; no murmurs,  no edema    Respiratory:  clear to auscultation bilaterally; no wheezing, rhonchi or rales GI: soft, nontender MS: no deformity or atrophy  Skin: warm and dry  Neuro:  no focal deficits  Psych: Alert and oriented x 3, normal affect  Wt Readings from Last 3 Encounters:  03/12/17 223 lb 8 oz (101.4 kg)  05/01/16 213 lb (96.6 kg)  02/20/16 226 lb 12.8 oz (102.9 kg)      Studies/Labs Reviewed:     EKG:  EKG is  ordered today.  The ekg ordered today demonstrates Sinus brady, HR 54, normal axis, no ST changes, QTc 394 ms, no changes 03/12/17  SR rate 66 normal   Recent Labs: No results found for requested labs within last 8760 hours.  Labs 11/14/15 (from PCP): K 4.5, creatinine 0.93, ALT 26, TC 179, HDL 45, LDL 110, Hgb 16.2, TSH 1.617  Recent Lipid Panel No results found for: CHOL, TRIG, HDL, CHOLHDL, VLDL,  LDLCALC, LDLDIRECT  Additional studies/ records that were reviewed today include:   Cardiac CT 3/16 IMPRESSION: Coronary calcium score of 0.   ASSESSMENT:     1. Essential hypertension     PLAN:     In order of problems listed above:  1. Elevated BP -  Well controlled.  Continue current medications and low sodium Dash type diet.    2. FHx of CAD - Ca score 0.  No angina.  Continue ASA, statin.   Repeat score 02/2018  3. Dyslipidemia - ALT 26, TC 179, TG 120, HDL 45, LDL 110 by labs with PCP 11/14/15.  Continue statin.   4. Thyroid: on replacement labs with primary   He will have his latest labs faxed to [place]   [employer]

## 2017-03-12 ENCOUNTER — Encounter: Payer: Self-pay | Admitting: Cardiovascular Disease

## 2017-03-12 ENCOUNTER — Ambulatory Visit (INDEPENDENT_AMBULATORY_CARE_PROVIDER_SITE_OTHER): Payer: Federal, State, Local not specified - PPO | Admitting: Cardiovascular Disease

## 2017-03-12 VITALS — BP 122/70 | HR 66 | Ht 65.0 in | Wt 223.5 lb

## 2017-03-12 DIAGNOSIS — I10 Essential (primary) hypertension: Secondary | ICD-10-CM

## 2017-03-12 NOTE — Patient Instructions (Signed)
Medication Instructions:  Your physician recommends that you continue on your current medications as directed. Please refer to the Current Medication list given to you today.   Labwork: NONE ORDERED  Testing/Procedures: NONE ORDERED   Follow-Up: Your physician wants you to follow-up in: 1 YEAR WITH DR. NISHAN You will receive a reminder letter in the mail two months in advance. If you don't receive a letter, please call our office to schedule the follow-up appointment.   Any Other Special Instructions Will Be Listed Below (If Applicable).     If you need a refill on your cardiac medications before your next appointment, please call your pharmacy.   

## 2017-07-04 ENCOUNTER — Emergency Department (HOSPITAL_COMMUNITY)
Admission: EM | Admit: 2017-07-04 | Discharge: 2017-07-05 | Disposition: A | Discharge status: Home / Self Care | Payer: Federal, State, Local not specified - PPO | Attending: Emergency Medicine | Admitting: Emergency Medicine

## 2017-07-04 ENCOUNTER — Encounter (HOSPITAL_COMMUNITY): Payer: Self-pay | Admitting: Emergency Medicine

## 2017-07-04 DIAGNOSIS — I10 Essential (primary) hypertension: Secondary | ICD-10-CM | POA: Diagnosis not present

## 2017-07-04 DIAGNOSIS — Z87891 Personal history of nicotine dependence: Secondary | ICD-10-CM | POA: Insufficient documentation

## 2017-07-04 DIAGNOSIS — Z7982 Long term (current) use of aspirin: Secondary | ICD-10-CM | POA: Insufficient documentation

## 2017-07-04 DIAGNOSIS — K645 Perianal venous thrombosis: Secondary | ICD-10-CM | POA: Diagnosis not present

## 2017-07-04 DIAGNOSIS — E039 Hypothyroidism, unspecified: Secondary | ICD-10-CM | POA: Insufficient documentation

## 2017-07-04 DIAGNOSIS — K649 Unspecified hemorrhoids: Secondary | ICD-10-CM | POA: Diagnosis present

## 2017-07-04 DIAGNOSIS — Z79899 Other long term (current) drug therapy: Secondary | ICD-10-CM | POA: Insufficient documentation

## 2017-07-04 HISTORY — DX: Unspecified hemorrhoids: K64.9

## 2017-07-04 LAB — TYPE AND SCREEN
ABO/RH(D): O POS
Antibody Screen: NEGATIVE

## 2017-07-04 LAB — CBC
HCT: 45.5 % (ref 39.0–52.0)
Hemoglobin: 15.5 g/dL (ref 13.0–17.0)
MCH: 29.6 pg (ref 26.0–34.0)
MCHC: 34.1 g/dL (ref 30.0–36.0)
MCV: 86.8 fL (ref 78.0–100.0)
Platelets: 249 10*3/uL (ref 150–400)
RBC: 5.24 MIL/uL (ref 4.22–5.81)
RDW: 14.1 % (ref 11.5–15.5)
WBC: 11 10*3/uL — ABNORMAL HIGH (ref 4.0–10.5)

## 2017-07-04 LAB — COMPREHENSIVE METABOLIC PANEL
ALT: 19 U/L (ref 17–63)
AST: 25 U/L (ref 15–41)
Albumin: 4 g/dL (ref 3.5–5.0)
Alkaline Phosphatase: 67 U/L (ref 38–126)
Anion gap: 9 (ref 5–15)
BILIRUBIN TOTAL: 1.2 mg/dL (ref 0.3–1.2)
BUN: 14 mg/dL (ref 6–20)
CO2: 23 mmol/L (ref 22–32)
Calcium: 9.3 mg/dL (ref 8.9–10.3)
Chloride: 103 mmol/L (ref 101–111)
Creatinine, Ser: 0.82 mg/dL (ref 0.61–1.24)
GFR calc Af Amer: >60 mL/min (ref >60)
GFR calc non Af Amer: >60 mL/min (ref >60)
Glucose, Bld: 107 mg/dL — ABNORMAL HIGH (ref 65–99)
POTASSIUM: 3.7 mmol/L (ref 3.5–5.1)
SODIUM: 135 mmol/L (ref 135–145)
TOTAL PROTEIN: 7.2 g/dL (ref 6.5–8.1)

## 2017-07-04 MED ORDER — HYDROMORPHONE HCL 1 MG/ML IJ SOLN
1.0000 mg | Freq: Once | INTRAMUSCULAR | Status: AC
  Administered 2017-07-04: 1 mg via INTRAMUSCULAR
  Filled 2017-07-04: qty 1

## 2017-07-04 MED ORDER — LIDOCAINE-EPINEPHRINE (PF) 2 %-1:200000 IJ SOLN
10.0000 mL | Freq: Once | INTRAMUSCULAR | Status: DC
  Filled 2017-07-04: qty 20

## 2017-07-04 MED ORDER — LIDOCAINE VISCOUS 2 % MT SOLN
15.0000 mL | Freq: Once | OROMUCOSAL | Status: AC
  Administered 2017-07-04: 15 mL via OROMUCOSAL
  Filled 2017-07-04: qty 15

## 2017-07-04 NOTE — ED Provider Notes (Signed)
[facility] DEPT Provider Note   CSN: [id number] Arrival date & time: 07/04/17  2030     History   Chief Complaint Chief Complaint  Patient presents with  . Hemorrhoids    HPI [place] is a 46 y.o. male.  This a 46 year old male with a history of hemorrhoids.  He states when he woke this morning he noticed that hemorrhoids were slightly enlarged and painful.  They've progressed through the day.  To become more painful and enlarged.  He went to urgent care at approximately 4:00 this afternoon when the physician at the urgent care.  Attempted to treat his thrombosed hemorrhoids with a stab type incision.  Patient states he was shown several large clots that were removed.  He was discharged home with instructions not to remove the dressing for several hours and given a prescription for Anusol suppository. Patient states that when he removes the dressing and attempted to insert the suppository, which she was successful in he came away in his hand was covered in blood.  He noticed that the hemorrhoids were larger and more painful.      Past Medical History:  Diagnosis Date  . Hemorrhoids   . Hyperlipidemia   . Hypothyroid   . Low TSH level     Patient Active Problem List   Diagnosis Date Noted  . Essential hypertension 07/08/2016  . Family history of premature CAD 02/20/2016  . Hypothyroid   . Low TSH level   . Dyslipidemia     History reviewed. No pertinent surgical history.     Home Medications    Prior to Admission medications   Medication Sig Start Date End Date Taking? Authorizing Provider  albuterol (PROVENTIL HFA;VENTOLIN HFA) 108 (90 BASE) MCG/ACT inhaler Inhale 2 puffs into the lungs every 6 (six) hours as needed for wheezing or shortness of breath.    [provider]  aspirin 81 MG tablet Take 81 mg by mouth daily.    [provider]  fluticasone (FLONASE) 50 MCG/ACT nasal spray Place 2 sprays into both nostrils daily as needed.  allergies 12/24/14   [provider]  HYDROcodone-acetaminophen (NORCO/VICODIN) 5-325 MG tablet Take 1-2 tablets by mouth every 6 (six) hours as needed for severe pain. 07/05/17   Junius Creamer, NP  levothyroxine (SYNTHROID, LEVOTHROID) 112 MCG tablet Take 112 mcg by mouth daily. 12/08/14   [provider]  montelukast (SINGULAIR) 10 MG tablet Take 10 mg by mouth daily as needed (FOR ALLERGIES).  11/20/14   [provider]  nitroGLYCERIN (NITROGLYN) 2 % OINT ointment Apply 1 application topically every 12 (twelve) hours. 07/05/17 07/08/17  Junius Creamer, NP  simvastatin (ZOCOR) 5 MG tablet TAKE 10 MG TABLET BY MOUTH EVERY OTHER DAY 01/02/15   [provider]  triamterene-hydrochlorothiazide (MAXZIDE-25) 37.5-25 MG tablet TAKE 1 TABLET BY MOUTH EVERY DAY 11/18/16   Liliane Shi, PA-C    Family History Family History  Problem Relation Age of Onset  . Heart Problems Mother 76       HEART VALUE REPLACEMENT  . Heart attack Father 68    Social History Social History  Substance Use Topics  . Smoking status: Former Smoker    Quit date: 1998  . Smokeless tobacco: Never Used  . Alcohol use No     Allergies   Patient has no known allergies.   Review of Systems Review of Systems  Constitutional: Negative for fever.  Gastrointestinal: Positive for anal bleeding. Negative for abdominal pain.  All other  systems reviewed and are negative.    Physical Exam Updated Vital Signs BP 138/90   Pulse 82   Temp 98 F (36.7 C) (Oral)   Resp 17   Ht 5\' 5"  (1.651 m)   Wt 92.5 kg (204 lb)   SpO2 97%   BMI 33.95 kg/m   Physical Exam  Constitutional: He appears well-developed and well-nourished. He appears distressed.  HENT:  Head: Normocephalic.  Eyes: Pupils are equal, round, and reactive to light.  Neck: Normal range of motion.  Cardiovascular: Normal rate.   Pulmonary/Chest: Effort normal.  Abdominal: Soft. He exhibits no distension. There is no  tenderness.  Genitourinary: Rectal exam shows external hemorrhoid.     Nursing note and vitals reviewed.    ED Treatments / Results  Labs (all labs ordered are listed, but only abnormal results are displayed) Labs Reviewed  COMPREHENSIVE METABOLIC PANEL - Abnormal; Notable for the following:       Result Value   Glucose, Bld 107 (*)    All other components within normal limits  CBC - Abnormal; Notable for the following:    WBC 11.0 (*)    All other components within normal limits  POC OCCULT BLOOD, ED  TYPE AND SCREEN  ABO/RH    EKG  EKG Interpretation None       Radiology No results found.  Procedures Procedures (including critical care time)  Medications Ordered in ED Medications  lidocaine-EPINEPHrine (XYLOCAINE W/EPI) 2 %-1:200000 (PF) injection 10 mL (not administered)  HYDROmorphone (DILAUDID) injection 1 mg (1 mg Intramuscular Given 07/04/17 2304)  lidocaine (XYLOCAINE) 2 % viscous mouth solution 15 mL (15 mLs Mouth/Throat Given 07/04/17 2305)     Initial Impression / Assessment and Plan / ED Course  I have reviewed the triage vital signs and the nursing notes.  Pertinent labs & imaging results that were available during my care of the patient were reviewed by me and considered in my medical decision making (see chart for details).      Thrombus hemorrhoids appear to be inadequately drained.  Viscous Lidocaine has been applied.  Patient has been given IM Dilaudid  Final Clinical Impressions(s) / ED Diagnoses   Final diagnoses:  Thrombosed external hemorrhoid    New Prescriptions New Prescriptions   HYDROCODONE-ACETAMINOPHEN (NORCO/VICODIN) 5-325 MG TABLET    Take 1-2 tablets by mouth every 6 (six) hours as needed for severe pain.   NITROGLYCERIN (NITROGLYN) 2 % OINT OINTMENT    Apply 1 application topically every 12 (twelve) hours.     Junius Creamer, NP 07/05/17 [phone]

## 2017-07-04 NOTE — ED Triage Notes (Signed)
Pt to ED c/o hemorrhoids that continue to bleed - was at urgent care earlier today and had 2 on the right side and 1 on the left cut.  Staff told patient to leave gauze in place for 3-4 hours, and at 3 hours, pt removed the gauze to put ointment over them and noticed large amount of blood on his hand. Pt reports worsened pain in rectum. Denies dizziness.

## 2017-07-05 LAB — ABO/RH: ABO/RH(D): O POS

## 2017-07-05 MED ORDER — "NITROGLYCERIN NICU 2% OINTMENT ": 1.0000 "application " | TOPICAL_OINTMENT | Freq: Two times a day (BID) | TRANSDERMAL | 0 refills | Status: DC

## 2017-07-05 MED ORDER — HYDROCODONE-ACETAMINOPHEN 5-325 MG PO TABS: 1.0000 | ORAL_TABLET | Freq: Four times a day (QID) | ORAL | 0 refills | Status: DC | PRN

## 2017-07-05 NOTE — Discharge Instructions (Signed)
Your thrombosed hemorrhoids have been removed in the emergency department. You will still need to follow up with [facility] due to the extent of the hemorrhoids, you have been prescribed nitroglycerin ointment- please place a small amount on the hemorrhoids twice a day You have been given a prescription for Vicodin- use for severe pain for the next 1-2 days.  T(his is constipating so make sure you drink enough water.)  You will need to keep the stools soft, while your hemorrhoids are healing.  We recommended use Mirilax -one capsule twice a day for the next week We also recommend that you use a sitz bath 3-4 times a day for at least 3 days, but it would be better if he went out to a full week. You can expect some bleeding from the site for the next 2-3 days.  It will gradually ease off, but if you find that you are bleeding profusely, you  become weak or dizzy you need to return for further evaluation

## 2017-07-05 NOTE — ED Notes (Signed)
Pt verbalized understanding discharge instructions and denies any further needs or questions at this time. VS stable, ambulatory and steady gait.   

## 2017-07-05 NOTE — ED Provider Notes (Signed)
Medical screening examination/treatment/procedure(s) were conducted as a shared visit with non-physician practitioner(s) and myself.  I personally evaluated the patient during the encounter. Briefly, the patient is a 46 y.o. male who presented to the ED with rectal pain secondary to several thrombosed external hemorrhoids. These were incision and drainage was performed at bedside by me.  Marland Kitchen.Incision and Drainage Date/Time: 07/05/2017 12:21 AM Performed by: Fatima Blank Authorized by: Fatima Blank   Consent:    Consent obtained:  Verbal   Consent given by:  Patient   Risks discussed:  Bleeding, incomplete drainage and infection   Alternatives discussed:  Delayed treatment Location:    Type:  External thrombosed hemorrhoid   Size:  2.5 cm   Location:  Anogenital   Anogenital location:  Perirectal Pre-procedure details:    Skin preparation:  Betadine Anesthesia (see MAR for exact dosages):    Anesthesia method:  Topical application and local infiltration   Topical anesthetic:  Lidocaine gel   Local anesthetic:  Lidocaine 1% WITH epi Procedure type:    Complexity:  Complex Procedure details:    Incision types:  Elliptical   Incision depth:  Subcutaneous   Scalpel blade:  11   Wound management:  Debrided   Drainage:  Bloody   Drainage amount:  Moderate   Wound treatment:  Wound left open   Packing materials:  None Post-procedure details:    Patient tolerance of procedure:  Tolerated well, no immediate complications   .Marland KitchenIncision and Drainage Date/Time: 07/05/2017 12:22 AM Performed by: Fatima Blank Authorized by: Fatima Blank   Consent:    Consent obtained:  Verbal   Consent given by:  Patient Location:    Type:  External thrombosed hemorrhoid   Size:  2 cm   Location:  Anogenital   Anogenital location:  Perirectal Pre-procedure details:    Skin preparation:  Betadine Anesthesia (see MAR for exact dosages):    Anesthesia method:   Topical application and local infiltration   Topical anesthetic:  Lidocaine gel   Local anesthetic:  Lidocaine 1% WITH epi Procedure type:    Complexity:  Complex Procedure details:    Incision types:  Elliptical   Incision depth:  Subcutaneous   Scalpel blade:  11   Wound management:  Debrided   Drainage:  Bloody   Drainage amount:  Moderate Post-procedure details:    Patient tolerance of procedure:  Tolerated well, no immediate complications   Will refer patient to the general surgeon for continued management as needed. Patient will be provided with prescription for perirectal ointment for pain.  The patient is safe for discharge with strict return precautions.    Fatima Blank, MD 07/05/17 [phone]

## 2017-07-09 ENCOUNTER — Other Ambulatory Visit: Payer: Self-pay | Admitting: Physician Assistant

## 2017-07-09 DIAGNOSIS — I10 Essential (primary) hypertension: Secondary | ICD-10-CM

## 2017-10-08 ENCOUNTER — Ambulatory Visit: Payer: Self-pay | Admitting: General Surgery

## 2017-10-08 NOTE — H&P (View-Only) (Signed)
History of Present Illness Leighton Ruff MD; 81/19/1478 4:17 PM) The patient is a 46 year old male who presents with anal pain. He is presenting with a 2 week history of anal discomfort/"funny feeling" which began after a hard BM. He has had one episode of bleeding associated with this. He saw Puja in July, after he was seen by a previous provider who attempted to incise and drain his thrombosed external hemorrhoid. He has a h/o rectal bleeding in the past and underwent a colonoscopy at age 66 for similar symptoms. He says he has been using metamucil pills which helps regulate his stool. Denies fever/chills. Denies sharp pain with bowel movements. No history of anal fissure.    Problem List/Past Medical Leighton Ruff, MD; 29/56/2130 4:20 PM) EXTERNAL THROMBOSED HEMORRHOIDS (K64.5) ANAL PAIN (K62.89)  Past Surgical History Leighton Ruff, MD; 86/57/8469 4:20 PM) Open Inguinal Hernia Surgery Bilateral. multiple Oral Surgery  Diagnostic Studies History Leighton Ruff, MD; 62/95/2841 4:20 PM) Colonoscopy >10 years ago  Allergies Mammie Lorenzo, LPN; 32/44/0102 7:25 PM) No Known Allergies 07/17/2017 Allergies Reconciled  Medication History Mammie Lorenzo, LPN; 36/64/4034 7:42 PM) Anusol-HC (25MG  Suppository, 1 (one) suppository Rectal two times daily, Taken starting 09/22/2017) Active. Aspirin (81MG  Tablet DR, Oral) Active. Albuterol (90MCG/ACT Aerosol Soln, Inhalation) Active. Fluticasone Furoate (100MCG/ACT Aero Pow Br Act, Inhalation) Active. Levothyroxine Sodium (112MCG Tablet, Oral) Active. Simvastatin (10MG  Tablet, Oral) Active. Triamterene-HCTZ (37.5-25MG  Capsule, Oral) Active. Testosterone Cypionate (200MG /ML Solution, Intramuscular) Active. Sildenafil Citrate (20MG  Tablet, Oral) Active. Medications Reconciled  Social History Leighton Ruff, MD; 59/56/3875 4:20 PM) Alcohol use Remotely quit alcohol use. Caffeine use Coffee. Illicit drug use  Prefer to discuss with provider. Tobacco use Former smoker.  Family History Leighton Ruff, MD; 64/33/2951 4:20 PM) Heart Disease Father, Mother. Heart disease in male family member before age 77 Hypertension Father, Mother. Respiratory Condition Mother. Thyroid problems Mother.  Other Problems Leighton Ruff, MD; 88/41/6606 4:20 PM) Hemorrhoids Hypercholesterolemia Inguinal Hernia Thyroid Disease     Review of Systems Leighton Ruff MD; 30/16/0109 4:21 PM) General Not Present- Appetite Loss, Chills, Fatigue, Fever, Night Sweats, Weight Gain and Weight Loss. Skin Not Present- Change in Wart/Mole, Dryness, Hives, Jaundice, New Lesions, Non-Healing Wounds, Rash and Ulcer. HEENT Present- Seasonal Allergies and Wears glasses/contact lenses. Not Present- Earache, Hearing Loss, Hoarseness, Nose Bleed, Oral Ulcers, Ringing in the Ears, Sinus Pain, Sore Throat, Visual Disturbances and Yellow Eyes. Respiratory Not Present- Bloody sputum, Chronic Cough, Difficulty Breathing, Snoring and Wheezing. Breast Not Present- Breast Mass, Breast Pain, Nipple Discharge and Skin Changes. Cardiovascular Not Present- Chest Pain, Difficulty Breathing Lying Down, Leg Cramps, Palpitations, Rapid Heart Rate, Shortness of Breath and Swelling of Extremities. Gastrointestinal Present- Hemorrhoids. Not Present- Abdominal Pain, Bloating, Bloody Stool, Change in Bowel Habits, Chronic diarrhea, Constipation, Difficulty Swallowing, Excessive gas, Gets full quickly at meals, Indigestion, Nausea, Rectal Pain and Vomiting. Male Genitourinary Present- Impotence. Not Present- Blood in Urine, Change in Urinary Stream, Frequency, Nocturia, Painful Urination, Urgency and Urine Leakage. Musculoskeletal Not Present- Back Pain, Joint Pain, Joint Stiffness, Muscle Pain, Muscle Weakness and Swelling of Extremities. Neurological Not Present- Decreased Memory, Fainting, Headaches, Numbness, Seizures, Tingling, Tremor, Trouble  walking and Weakness. Psychiatric Not Present- Anxiety, Bipolar, Change in Sleep Pattern, Depression, Fearful and Frequent crying. Endocrine Not Present- Cold Intolerance, Excessive Hunger, Hair Changes, Heat Intolerance and New Diabetes. Hematology Not Present- Blood Thinners, Easy Bruising, Excessive bleeding, Gland problems, HIV and Persistent Infections.  Vitals Claiborne Billings Dockery LPN; 32/35/5732 2:02 PM) 10/08/2017 4:02 PM Weight: 209  lb Height: 65in Body Surface Area: 2.02 m Body Mass Index: 34.78 kg/m  Temp.: 98.95F(Oral)  Pulse: 83 (Regular)  BP: 122/76 (Sitting, Left Arm, Standard)      Physical Exam Leighton Ruff MD; 27/02/5008 4:24 PM)  General Mental Status-Alert. General Appearance-Not in acute distress. Build & Nutrition-Well nourished. Posture-Normal posture. Gait-Normal.  Head and Neck Head-normocephalic, atraumatic with no lesions or palpable masses. Trachea-midline.  Chest and Lung Exam Chest and lung exam reveals -on auscultation, normal breath sounds, no adventitious sounds and normal vocal resonance.  Cardiovascular Cardiovascular examination reveals -normal heart sounds, regular rate and rhythm with no murmurs and no digital clubbing, cyanosis, edema, increased warmth or tenderness.  Abdomen Inspection Inspection of the abdomen reveals - No Hernias. Palpation/Percussion Palpation and Percussion of the abdomen reveal - Soft, Non Tender, No Rigidity (guarding), No hepatosplenomegaly and No Palpable abdominal masses.  Rectal Anorectal Exam External - skin tag, no anal fissures. Internal - normal internal exam(soft stool in rectal vault) and normal sphincter tone.  Neurologic Neurologic evaluation reveals -alert and oriented x 3 with no impairment of recent or remote memory, normal attention span and ability to concentrate, normal sensation and normal coordination.  Musculoskeletal Normal Exam - Bilateral-Upper  Extremity Strength Normal and Lower Extremity Strength Normal.   Results Leighton Ruff MD; 38/18/2993 4:23 PM) Procedures  Name Value Date ANOSCOPY, DIAGNOSTIC (71696) [ Hemorrhoids ] Procedure Other: Procedure: Anoscopy Surgeon: Marcello Moores After the risks and benefits were explained, verbal consent was obtained for above procedure. A medical assistant chaperone was present thoroughout the entire procedure. Anesthesia: none Diagnosis: rectal bleeding, anal pain Findings: Grade 1 internal hemorrhoids without signs of inflammation, normal exam  Performed: 10/08/2017 4:22 PM    Assessment & Plan Leighton Ruff MD; 78/93/8101 4:23 PM)  ANAL PAIN (K62.89) Impression: 47 year old male who presents to the office with a recent episode of anal pain and rectal bleeding. On exam today he has no signs of anal pathology. I recommended a colonoscopy to make sure that there is no other source for his rectal bleeding. We have discussed this in detail and he agrees to proceed.

## 2017-10-08 NOTE — H&P (Signed)
History of Present Illness Tanner Ruff MD; 89/[birth year]1941 4:17 PM) The patient is a 46 year old male who presents with anal pain. He is presenting with a 2 week history of anal discomfort/"funny feeling" which began after a hard BM. He has had one episode of bleeding associated with this. He saw [facility]ja in July, after he was seen by a previous provider who attempted to incise and drain his thrombosed external hemorrhoid. He has a h/o rectal bleeding in the past and underwent a colonoscopy at age 67 for similar symptoms. He says he has been using metamucil pills which helps regulate his stool. Denies fever/chills. Denies sharp pain with bowel movements. No history of anal fissure.    Problem List/Past Medical Tanner Ruff, MD; 74/07/1447 4:20 PM) EXTERNAL THROMBOSED HEMORRHOIDS (K64.5) ANAL PAIN (K62.89)  Past Surgical History Tanner Ruff, MD; 18/56/3149 4:20 PM) Open Inguinal Hernia Surgery Bilateral. multiple Oral Surgery  Diagnostic Studies History Tanner Ruff, MD; 70/26/3785 4:20 PM) Colonoscopy >10 years ago  Allergies Mammie Lorenzo, LPN; 88/50/2774 1:28 PM) No Known Allergies 07/17/2017 Allergies Reconciled  Medication History Mammie Lorenzo, LPN; 78/67/6720 9:47 PM) Anusol-HC (25MG  Suppository, 1 (one) suppository Rectal two times daily, Taken starting 09/22/2017) Active. Aspirin (81MG  Tablet DR, Oral) Active. Albuterol (90MCG/ACT Aerosol Soln, Inhalation) Active. Fluticasone Furoate (100MCG/ACT Aero Pow Br Act, Inhalation) Active. Levothyroxine Sodium (112MCG Tablet, Oral) Active. Simvastatin (10MG  Tablet, Oral) Active. Triamterene-HCTZ (37.5-25MG  Capsule, Oral) Active. Testosterone Cypionate (200MG /ML Solution, Intramuscular) Active. Sildenafil Citrate (20MG  Tablet, Oral) Active. Medications Reconciled  Social History Tanner Ruff, MD; 09/62/8366 4:20 PM) Alcohol use Remotely quit alcohol use. Caffeine use Coffee. Illicit drug use  Prefer to discuss with provider. Tobacco use Former smoker.  Family History Tanner Ruff, MD; 29/47/6546 4:20 PM) Heart Disease Father, Mother. Heart disease in male family member before age 90 Hypertension Father, Mother. Respiratory Condition Mother. Thyroid problems Mother.  Other Problems Tanner Ruff, MD; 50/35/4656 4:20 PM) Hemorrhoids Hypercholesterolemia Inguinal Hernia Thyroid Disease     Review of Systems Tanner Ruff MD; 81/27/5170 4:21 PM) General Not Present- Appetite Loss, Chills, Fatigue, Fever, Night Sweats, Weight Gain and Weight Loss. Skin Not Present- Change in Wart/Mole, Dryness, Hives, Jaundice, New Lesions, Non-Healing Wounds, Rash and Ulcer. HEENT Present- Seasonal Allergies and Wears glasses/contact lenses. Not Present- Earache, Hearing Loss, Hoarseness, Nose Bleed, Oral Ulcers, Ringing in the Ears, Sinus Pain, Sore Throat, Visual Disturbances and Yellow Eyes. Respiratory Not Present- Bloody sputum, Chronic Cough, Difficulty Breathing, Snoring and Wheezing. Breast Not Present- Breast Mass, Breast Pain, Nipple Discharge and Skin Changes. Cardiovascular Not Present- Chest Pain, Difficulty Breathing Lying Down, Leg Cramps, Palpitations, Rapid Heart Rate, Shortness of Breath and Swelling of Extremities. Gastrointestinal Present- Hemorrhoids. Not Present- Abdominal Pain, Bloating, Bloody Stool, Change in Bowel Habits, Chronic diarrhea, Constipation, Difficulty Swallowing, Excessive gas, Gets full quickly at meals, Indigestion, Nausea, Rectal Pain and Vomiting. Male Genitourinary Present- Impotence. Not Present- Blood in Urine, Change in Urinary Stream, Frequency, Nocturia, Painful Urination, Urgency and Urine Leakage. Musculoskeletal Not Present- Back Pain, Joint Pain, Joint Stiffness, Muscle Pain, Muscle Weakness and Swelling of Extremities. Neurological Not Present- Decreased Memory, Fainting, Headaches, Numbness, Seizures, Tingling, Tremor, Trouble  walking and Weakness. Psychiatric Not Present- Anxiety, Bipolar, Change in Sleep Pattern, Depression, Fearful and Frequent crying. Endocrine Not Present- Cold Intolerance, Excessive Hunger, Hair Changes, Heat Intolerance and New Diabetes. Hematology Not Present- Blood Thinners, Easy Bruising, Excessive bleeding, Gland problems, HIV and Persistent Infections.  Vitals Claiborne Billings Dockery LPN; 01/74/9449 6:75 PM) 10/08/2017 4:02 PM Weight: 209  lb Height: 65in Body Surface Area: 2.02 m Body Mass Index: 34.78 kg/m  Temp.: 98.61F(Oral)  Pulse: 83 (Regular)  BP: 122/76 (Sitting, Left Arm, Standard)      Physical Exam Tanner Ruff MD; 53/74/8270 4:24 PM)  General Mental Status-Alert. General Appearance-Not in acute distress. Build & Nutrition-Well nourished. Posture-Normal posture. Gait-Normal.  Head and Neck Head-normocephalic, atraumatic with no lesions or palpable masses. Trachea-midline.  Chest and Lung Exam Chest and lung exam reveals -on auscultation, normal breath sounds, no adventitious sounds and normal vocal resonance.  Cardiovascular Cardiovascular examination reveals -normal heart sounds, regular rate and rhythm with no murmurs and no digital clubbing, cyanosis, edema, increased warmth or tenderness.  Abdomen Inspection Inspection of the abdomen reveals - No Hernias. Palpation/Percussion Palpation and Percussion of the abdomen reveal - Soft, Non Tender, No Rigidity (guarding), No hepatosplenomegaly and No Palpable abdominal masses.  Rectal Anorectal Exam External - skin tag, no anal fissures. Internal - normal internal exam(soft stool in rectal vault) and normal sphincter tone.  Neurologic Neurologic evaluation reveals -alert and oriented x 3 with no impairment of recent or remote memory, normal attention span and ability to concentrate, normal sensation and normal coordination.  Musculoskeletal Normal Exam - Bilateral-Upper  Extremity Strength Normal and Lower Extremity Strength Normal.   Results Tanner Ruff MD; 78/67/5449 4:23 PM) Procedures  Name Value Date ANOSCOPY, DIAGNOSTIC (20100) [ Hemorrhoids ] Procedure Other: Procedure: Anoscopy Surgeon: Marcello Moores After the risks and benefits were explained, verbal consent was obtained for above procedure. A medical assistant chaperone was present thoroughout the entire procedure. Anesthesia: none Diagnosis: rectal bleeding, anal pain Findings: Grade 1 internal hemorrhoids without signs of inflammation, normal exam  Performed: 10/08/2017 4:22 PM    Assessment & Plan Tanner Ruff MD; 71/21/9758 4:23 PM)  ANAL PAIN (K62.89) Impression: 46 year old male who presents to the office with a recent episode of anal pain and rectal bleeding. On exam today he has no signs of anal pathology. I recommended a colonoscopy to make sure that there is no other source for his rectal bleeding. We have discussed this in detail and he agrees to proceed.

## 2017-10-28 ENCOUNTER — Encounter (HOSPITAL_COMMUNITY): Payer: Self-pay

## 2017-10-28 ENCOUNTER — Other Ambulatory Visit: Payer: Self-pay

## 2017-11-04 ENCOUNTER — Ambulatory Visit (HOSPITAL_COMMUNITY): Payer: Federal, State, Local not specified - PPO | Admitting: Anesthesiology

## 2017-11-04 ENCOUNTER — Other Ambulatory Visit: Payer: Self-pay

## 2017-11-04 ENCOUNTER — Encounter (HOSPITAL_COMMUNITY)
Admission: RE | Disposition: A | Discharge status: Home / Self Care | Payer: Self-pay | Source: Ambulatory Visit | Attending: General Surgery

## 2017-11-04 ENCOUNTER — Encounter (HOSPITAL_COMMUNITY): Payer: Self-pay

## 2017-11-04 ENCOUNTER — Ambulatory Visit (HOSPITAL_COMMUNITY)
Admission: RE | Admit: 2017-11-04 | Discharge: 2017-11-04 | Disposition: A | Discharge status: Home / Self Care | Payer: Federal, State, Local not specified - PPO | Source: Ambulatory Visit | Attending: General Surgery | Admitting: General Surgery

## 2017-11-04 DIAGNOSIS — Z7982 Long term (current) use of aspirin: Secondary | ICD-10-CM | POA: Insufficient documentation

## 2017-11-04 DIAGNOSIS — Z8249 Family history of ischemic heart disease and other diseases of the circulatory system: Secondary | ICD-10-CM | POA: Diagnosis not present

## 2017-11-04 DIAGNOSIS — D124 Benign neoplasm of descending colon: Secondary | ICD-10-CM | POA: Insufficient documentation

## 2017-11-04 DIAGNOSIS — K529 Noninfective gastroenteritis and colitis, unspecified: Secondary | ICD-10-CM | POA: Insufficient documentation

## 2017-11-04 DIAGNOSIS — E7800 Pure hypercholesterolemia, unspecified: Secondary | ICD-10-CM | POA: Insufficient documentation

## 2017-11-04 DIAGNOSIS — Z87891 Personal history of nicotine dependence: Secondary | ICD-10-CM | POA: Insufficient documentation

## 2017-11-04 DIAGNOSIS — Z1211 Encounter for screening for malignant neoplasm of colon: Secondary | ICD-10-CM | POA: Insufficient documentation

## 2017-11-04 DIAGNOSIS — Z79899 Other long term (current) drug therapy: Secondary | ICD-10-CM | POA: Insufficient documentation

## 2017-11-04 DIAGNOSIS — D123 Benign neoplasm of transverse colon: Secondary | ICD-10-CM | POA: Insufficient documentation

## 2017-11-04 DIAGNOSIS — Z8349 Family history of other endocrine, nutritional and metabolic diseases: Secondary | ICD-10-CM | POA: Insufficient documentation

## 2017-11-04 DIAGNOSIS — Z836 Family history of other diseases of the respiratory system: Secondary | ICD-10-CM | POA: Diagnosis not present

## 2017-11-04 DIAGNOSIS — E079 Disorder of thyroid, unspecified: Secondary | ICD-10-CM | POA: Diagnosis not present

## 2017-11-04 HISTORY — DX: Essential (primary) hypertension: I10

## 2017-11-04 HISTORY — PX: COLONOSCOPY WITH PROPOFOL: SHX5780

## 2017-11-04 HISTORY — DX: Cardiac murmur, unspecified: R01.1

## 2017-11-04 HISTORY — DX: Headache: R51

## 2017-11-04 HISTORY — DX: Pneumonia, unspecified organism: J18.9

## 2017-11-04 HISTORY — DX: Headache, unspecified: R51.9

## 2017-11-04 SURGERY — COLONOSCOPY WITH PROPOFOL
Anesthesia: Monitor Anesthesia Care

## 2017-11-04 SURGERY — Surgical Case
Anesthesia: *Unknown

## 2017-11-04 MED ORDER — LACTATED RINGERS IV SOLN
INTRAVENOUS | Status: DC
  Administered 2017-11-04: 09:00:00 via INTRAVENOUS

## 2017-11-04 MED ORDER — PROPOFOL 10 MG/ML IV BOLUS
INTRAVENOUS | Status: AC
  Filled 2017-11-04: qty 20

## 2017-11-04 MED ORDER — PROPOFOL 500 MG/50ML IV EMUL
INTRAVENOUS | Status: DC | PRN
  Administered 2017-11-04: 150 ug/kg/min via INTRAVENOUS

## 2017-11-04 MED ORDER — PROPOFOL 10 MG/ML IV BOLUS
INTRAVENOUS | Status: AC
  Filled 2017-11-04: qty 60

## 2017-11-04 MED ORDER — PROPOFOL 10 MG/ML IV BOLUS
INTRAVENOUS | Status: DC | PRN
  Administered 2017-11-04: 20 mg via INTRAVENOUS
  Administered 2017-11-04: 30 mg via INTRAVENOUS
  Administered 2017-11-04: 20 mg via INTRAVENOUS
  Administered 2017-11-04: 60 mg via INTRAVENOUS
  Administered 2017-11-04: 20 mg via INTRAVENOUS

## 2017-11-04 MED ORDER — LIDOCAINE 2% (20 MG/ML) 5 ML SYRINGE
INTRAMUSCULAR | Status: AC
  Filled 2017-11-04: qty 5

## 2017-11-04 MED ORDER — SODIUM CHLORIDE 0.9 % IV SOLN: INTRAVENOUS | Status: DC

## 2017-11-04 NOTE — Op Note (Signed)
[facility] Patient Name: Tanner Harrison Procedure Date: 11/04/2017 MRN: [id number] Attending MD: Leighton Ruff , MD Date of Birth: [birth year] CSN: [id number] Age: 46 Admit Type: Outpatient Procedure:                Colonoscopy Indications:              Screening for colorectal malignant neoplasm,                            Incidental - Hematochezia Providers:                Leighton Ruff, MD, Burtis Junes, RN, Cherylynn Ridges,                            Technician, Heide Scales, CRNA Referring MD:              Medicines:                Propofol per Anesthesia Complications:            No immediate complications. Estimated blood loss:                            Minimal. Estimated Blood Loss:     Estimated blood loss was minimal. Procedure:                Pre-Anesthesia Assessment:                           - Prior to the procedure, a History and Physical                            was performed, and patient medications and                            allergies were reviewed. The patient's tolerance of                            previous anesthesia was also reviewed. The risks                            and benefits of the procedure and the sedation                            options and risks were discussed with the patient.                            All questions were answered, and informed consent                            was obtained. Prior Anticoagulants: The patient has                            taken no previous anticoagulant or antiplatelet                            agents. ASA Grade Assessment: II -  A patient with                            mild systemic disease. After reviewing the risks                            and benefits, the patient was deemed in                            satisfactory condition to undergo the procedure.                           After obtaining informed consent, the colonoscope                            was passed under direct vision.  Throughout the                            procedure, the patient's blood pressure, pulse, and                            oxygen saturations were monitored continuously. The                            EC-[id number] ([id number]) scope was introduced through                            the anus and advanced to the the cecum, identified                            by appendiceal orifice and ileocecal valve. The                            colonoscopy was performed without difficulty. The                            patient tolerated the procedure well. The quality                            of the bowel preparation was good. The ileocecal                            valve and the appendiceal orifice were photographed. Scope In: 9:43:22 AM Scope Out: 10:21:41 AM Scope Withdrawal Time: 0 hours 25 minutes 7 seconds  Total Procedure Duration: 0 hours 38 minutes 19 seconds  Findings:      The perianal examination was normal.      A 2 mm polyp was found in the distal transverse colon. The polyp was       pedunculated. The polyp was removed with a cold snare. Resection and       retrieval were complete. Estimated blood loss was minimal.      Localized mild inflammation characterized by erosions and granularity       was found in the distal transverse colon. Biopsies were taken with a  cold forceps for histology. Estimated blood loss was minimal.      A 5 mm polyp was found in the descending colon. The polyp was sessile.       The polyp was removed with a piecemeal technique using a cold biopsy       forceps. Resection and retrieval were complete.      No additional abnormalities were found on retroflexion. Impression:               - One 2 mm polyp in the distal transverse colon,                            removed with a cold snare. Resected and retrieved.                           - Localized mild inflammation was found in the                            distal transverse colon. Biopsied.                            - One 5 mm polyp in the descending colon, removed                            piecemeal using a cold biopsy forceps. Resected and                            retrieved. Moderate Sedation:      Moderate (conscious) sedation was personally administered by an       anesthesia professional. The following parameters were monitored: oxygen       saturation, heart rate, blood pressure, and response to care. Recommendation:           - Discharge patient to home (ambulatory).                           - Advance diet as tolerated.                           - Continue present medications.                           - Await pathology results.                           - Repeat colonoscopy for surveillance based on                            pathology results.                           - Patient has a contact number available for                            emergencies. The signs and symptoms of potential  delayed complications were discussed with the                            patient. Return to normal activities tomorrow.                            Written discharge instructions were provided to the                            patient.                           - Return to normal activities tomorrow. Procedure Code(s):        --- Professional ---                           [phone], Colonoscopy, flexible; with removal of                            tumor(s), polyp(s), or other lesion(s) by snare                            technique                           45380, 82, Colonoscopy, flexible; with biopsy,                            single or multiple Diagnosis Code(s):        --- Professional ---                           Z12.11, Encounter for screening for malignant                            neoplasm of colon                           D12.3, Benign neoplasm of transverse colon (hepatic                            flexure or splenic flexure)                            D12.4, Benign neoplasm of descending colon                           K52.9, Noninfective gastroenteritis and colitis,                            unspecified CPT copyright 2016 American Medical [employer]. All rights reserved. The codes documented in this report are preliminary and upon coder review may  be revised to meet current compliance requirements. Leighton Ruff, MD Leighton Ruff, MD 00/93/8182 10:32:25 AM This report has been signed electronically. Number of Addenda: 0

## 2017-11-04 NOTE — Anesthesia Preprocedure Evaluation (Addendum)
Anesthesia Evaluation  Patient identified by MRN, date of birth, ID band Patient awake    Reviewed: Allergy & Precautions, NPO status , Patient's Chart, lab work & pertinent test results  Airway Mallampati: II  TM Distance: >3 FB Neck ROM: Full    Dental  (+) Dental Advisory Given, Teeth Intact   Pulmonary former smoker,    Pulmonary exam normal breath sounds clear to auscultation       Cardiovascular hypertension, Pt. on medications Normal cardiovascular exam Rhythm:Regular Rate:Normal  HLD   Neuro/Psych negative neurological ROS  negative psych ROS   GI/Hepatic negative GI ROS, Neg liver ROS,   Endo/Other  Hypothyroidism Obesity  Renal/GU negative Renal ROS  negative genitourinary   Musculoskeletal negative musculoskeletal ROS (+)   Abdominal   Peds  Hematology negative hematology ROS (+)   Anesthesia Other Findings   Reproductive/Obstetrics                            Anesthesia Physical Anesthesia Plan  ASA: II  Anesthesia Plan: MAC   Post-op Pain Management:    Induction: Intravenous  PONV Risk Score and Plan: Propofol infusion and Treatment may vary due to age or medical condition  Airway Management Planned: Nasal Cannula  Additional Equipment: None  Intra-op Plan:   Post-operative Plan:   Informed Consent: I have reviewed the patients History and Physical, chart, labs and discussed the procedure including the risks, benefits and alternatives for the proposed anesthesia with the patient or authorized representative who has indicated his/her understanding and acceptance.   Dental advisory given  Plan Discussed with: CRNA  Anesthesia Plan Comments:         Anesthesia Quick Evaluation

## 2017-11-04 NOTE — Anesthesia Procedure Notes (Signed)
Procedure Name: MAC Date/Time: 11/04/2017 9:44 AM Performed by: Deliah Boston, CRNA Pre-anesthesia Checklist: Patient identified, Emergency Drugs available, Suction available, Patient being monitored and Timeout performed Patient Re-evaluated:Patient Re-evaluated prior to induction Oxygen Delivery Method: Simple face mask Placement Confirmation: positive ETCO2 and CO2 detector

## 2017-11-04 NOTE — Transfer of Care (Signed)
Immediate Anesthesia Transfer of Care Note  Patient: Tanner Harrison  Procedure(s) Performed: Procedure(s): COLONOSCOPY WITH PROPOFOL DIAGNOSTIC (N/A)  Patient Location: PACU  Anesthesia Type:MAC  Level of Consciousness: Patient easily awoken, sedated, comfortable, cooperative, following commands, responds to stimulation.   Airway & Oxygen Therapy: Patient spontaneously breathing, ventilating well, oxygen via simple oxygen mask.  Post-op Assessment: Report given to PACU RN, vital signs reviewed and stable, moving all extremities.   Post vital signs: Reviewed and stable.  Complications: No apparent anesthesia complications  Last Vitals:  Vitals:   11/04/17 1025 11/04/17 1026  BP:  92/60  Pulse:  74  Resp:  14  Temp:    SpO2: 99% 99%    Last Pain:  Vitals:   11/04/17 1026  TempSrc: Oral         Complications: No apparent anesthesia complications

## 2017-11-04 NOTE — Interval H&P Note (Signed)
History and Physical Interval Note:  11/04/2017 9:29 AM  Tanner Harrison  has presented today for surgery, with the diagnosis of rectal bleeding  The various methods of treatment have been discussed with the patient and family. After consideration of risks, benefits and other options for treatment, the patient has consented to  Procedure(s): COLONOSCOPY WITH PROPOFOL DIAGNOSTIC (N/A) as a surgical intervention .  The patient's history has been reviewed, patient examined, no change in status, stable for surgery.  I have reviewed the patient's chart and labs.  Questions were answered to the patient's satisfaction.     Risks were explained to the patient.  These include bleeding, pain, perforation, missed pathology and need for additional procedures.  I believe he understands this and agrees to proceed.

## 2017-11-04 NOTE — Discharge Instructions (Signed)
Post Colonoscopy Instructions ° °1. DIET: Follow a light bland diet the first 24 hours after arrival home, such as soup, liquids, crackers, etc.  Be sure to include lots of fluids daily.  Avoid fast food or heavy meals as your are more likely to get nauseated.   °2. You may have some mild rectal bleeding for the first few days after the procedure.  This should get less and less with time.  Resume any blood thinners 2 days after your procedure unless directed otherwise by your physician. °3. Take your usually prescribed home medications unless otherwise directed. °a. If you have any pain, it is helpful to get up and walk around, as it is usually from excess gas. °b. If this is not helpful, you can take an over-the-counter pain medication.  Choose one of the following that works best for you: °i. Naproxen (Aleve, etc)  Two 220mg tabs twice a day °ii. Ibuprofen (Advil, etc) Three 200mg tabs four times a day (every meal & bedtime) °iii. If you still have pain after using one of these, please call the office °4. It is normal to not have a bowel movement for 2-3 days after colonoscopy.   ° °5. ACTIVITIES as tolerated:   °6. You may resume regular (light) daily activities beginning the next day--such as daily self-care, walking, climbing stairs--gradually increasing activities as tolerated.  ° ° °WHEN TO CALL US [phone]: °1. Fever over 101.5 F (38.5 C)  °2. Severe abdominal or chest pain  °3. Large amount of rectal bleeding, passing multiple blood clots  °4. Dizziness or shortness of breath °5. Increasing nausea or vomiting ° ° The clinic staff is available to answer your questions during regular business hours (8:30am-5pm).  Please don’t hesitate to call and ask to speak to one of our nurses for clinical concerns.  ° If you have a medical emergency, go to the nearest emergency room or call 911. ° A surgeon from [facility] is always on call at the hospitals ° ° °[facility] °[address], [address], [place], [place]  [address] ? °MAIN: [phone] ? TOLL FREE: [phone] ?  °FAX [phone] °[email or web address] ° ° °

## 2017-11-04 NOTE — Anesthesia Postprocedure Evaluation (Signed)
Anesthesia Post Note  Patient: Tanner Harrison  Procedure(s) Performed: COLONOSCOPY WITH PROPOFOL DIAGNOSTIC (N/A )     Patient location during evaluation: PACU Anesthesia Type: MAC Level of consciousness: awake and alert Pain management: pain level controlled Vital Signs Assessment: post-procedure vital signs reviewed and stable Respiratory status: spontaneous breathing, nonlabored ventilation and respiratory function stable Cardiovascular status: stable and blood pressure returned to baseline Anesthetic complications: no    Last Vitals:  Vitals:   11/04/17 1030 11/04/17 1040  BP: (!) 85/59 101/69  Pulse: 73 67  Resp: 18 16  Temp:    SpO2: 100% 95%    Last Pain:  Vitals:   11/04/17 1026  TempSrc: Oral                 Audry Pili

## 2017-11-05 ENCOUNTER — Encounter (HOSPITAL_COMMUNITY): Payer: Self-pay | Admitting: General Surgery

## 2018-02-02 ENCOUNTER — Telehealth: Payer: Self-pay | Admitting: Cardiovascular Disease

## 2018-02-02 DIAGNOSIS — I10 Essential (primary) hypertension: Secondary | ICD-10-CM

## 2018-02-02 DIAGNOSIS — E785 Hyperlipidemia, unspecified: Secondary | ICD-10-CM

## 2018-02-02 NOTE — Telephone Encounter (Signed)
Per Dr. Kyla Balzarine office note 03/12/18 -" FHx of CAD - Ca score 0.  No angina.  Continue ASA, statin.   Repeat score 02/2018". Will order CT Calcium score. Made an office visit appointment for patient to see Dr. Johnsie Cancel on 02/22/18. Will have patient schedule CT before appt.

## 2018-02-02 NOTE — Telephone Encounter (Signed)
Patient calling, states that at his last appointment he was told that he needs to have a CT CA Score completed before his follow-up this year.

## 2018-02-15 NOTE — Progress Notes (Signed)
Cardiology Office Note:    Date:  02/22/2018   ID:  Tanner Harrison, DOB Oct 13, 1971, MRN [id number]  PCP:  Karle Starch, MD  Cardiologist:  Dr. Jenkins Rouge   Electrophysiologist:  n/a  No chief complaint on file.   History of Present Illness:     Tanner Harrison is a 47 y.o. male with a hx of family history of premature CAD (father with MI late 57s, mom with heart valve replacement in her 2s), hypothyroidism, dyslipidemia. He was evaluated by me  in 2/16. Cardiac CT demonstrated calcium score 0.   Returns for FU. Doing well. He tries to run 1-2 times a week.  He denies chest pain, dyspnea, syncope, orthopnea, PND, edema.  He snores.  His wife has not noticed any apnea.  He denies daytime hypersomnolence.    Updated Calcium Score 02/17/18  0  No chest pain palpitations or dyspnea   Kids 9/11 playing soccor Reviewed labs from work nurse [employer]  LDL 140 A1c5.6 TSH 2.1    Past Medical History:  Diagnosis Date  . Headache    hx of as teen  . Heart murmur    hx of  . Hemorrhoids   . Hyperlipidemia   . Hypertension   . Hypothyroid   . Low TSH level   . Pneumonia    20 years ago    Past Surgical History:  Procedure Laterality Date  . colonocsopy     2003 11/04/17  . COLONOSCOPY WITH PROPOFOL N/A 11/04/2017   Procedure: COLONOSCOPY WITH PROPOFOL DIAGNOSTIC;  Surgeon: Leighton Ruff, MD;  Location: [facility];  Service: Endoscopy;  Laterality: N/A;  . HERNIA REPAIR     x2 bil inguinal, and 2007 L inguinal repaired  . Left hand cyst removed    . MOUTH SURGERY     teeth extraction    Current Medications: Outpatient Medications Prior to Visit  Medication Sig Dispense Refill  . albuterol (PROVENTIL HFA;VENTOLIN HFA) 108 (90 BASE) MCG/ACT inhaler Inhale 2 puffs into the lungs every 6 (six) hours as needed for wheezing or shortness of breath.    . Ascorbic Acid (VITAMIN C) POWD Take 1,000 mg daily by mouth.    . cetirizine (ZYRTEC) 10 MG tablet Take 10 mg  daily as needed by mouth for allergies.    . Cholecalciferol (VITAMIN D3) 5000 units CAPS Take 5,000 Units daily by mouth.    Marland Kitchen DHEA 50 MG TABS Take 50 mg daily by mouth.    Marland Kitchen ketotifen (ALAWAY) 0.025 % ophthalmic solution Place 1 drop daily as needed into both eyes (allergies).    Marland Kitchen levothyroxine (SYNTHROID, LEVOTHROID) 112 MCG tablet Take 112 mcg by mouth daily.  0  . Magnesium Citrate POWD Take 160 mg daily by mouth.    . Misc Natural Products (PROSTATE SUPPORT PO) Take 2 tablets daily by mouth.    Marland Kitchen OVER THE COUNTER MEDICATION Take 1 tablet daily with lunch by mouth. Glucose Optimizer OTC supplement    . psyllium (REGULOID) 0.52 g capsule Take 5 capsules 2 (two) times daily by mouth.    . ranitidine (ZANTAC) 75 MG tablet Take 75-150 mg daily as needed by mouth for heartburn.    . sildenafil (REVATIO) 20 MG tablet Take 20-100 mg daily as needed by mouth (erectile dysfunction).    Marland Kitchen testosterone cypionate (DEPOTESTOSTERONE CYPIONATE) 200 MG/ML injection Inject 0.5 mLs into the muscle every 14 (fourteen) days.   1  . thyroid (ARMOUR) 15 MG tablet Take 15 mg  See admin instructions by mouth. Take 15 mg daily by mouth on Tue, Thur, Sat, and Sun.  Take 30 mg once daily on Mon, Wed, and Fri    . triamterene-hydrochlorothiazide (MAXZIDE-25) 37.5-25 MG tablet Take 1 tablet by mouth daily. 90 tablet 2  . simvastatin (ZOCOR) 5 MG tablet Take 5 mg daily by mouth.   0  . ketorolac (TORADOL) 10 MG tablet Take 10 mg at bedtime as needed by mouth for moderate pain.    Marland Kitchen zinc gluconate 50 MG tablet Take 50 mg daily by mouth.     No facility-administered medications prior to visit.      Allergies:   Patient has no known allergies.   Social History   Socioeconomic History  . Marital status: Married    Spouse name: None  . Number of children: None  . Years of education: None  . Highest education level: None  Social Needs  . Financial resource strain: None  . Food insecurity - worry: None  . Food  insecurity - inability: None  . Transportation needs - medical: None  . Transportation needs - non-medical: None  Occupational History  . Occupation: tax Printmaker: [employer]  Tobacco Use  . Smoking status: Former Smoker    Last attempt to quit: 1998    Years since quitting: 21.1  . Smokeless tobacco: Never Used  Substance and Sexual Activity  . Alcohol use: No    Alcohol/week: 0.0 oz  . Drug use: No  . Sexual activity: Yes  Other Topics Concern  . None  Social History Narrative   Tax Administrator with [employer]   Married   4 children - 3 living; Daughter married and lives in [place], [place]     Family History:  The patient's family history includes Heart Problems (age of onset: 49) in his mother; Heart attack (age of onset: 40) in his father.   ROS:   Please see the history of present illness.    Review of Systems  Respiratory: Positive for snoring.    All other systems reviewed and are negative.   Physical Exam:    VS:  BP 126/88   Pulse 66   Ht 5\' 5"  (1.651 m)   Wt 223 lb 6.4 oz (101.3 kg)   BMI 37.18 kg/m    Affect appropriate Healthy:  appears stated age HEENT: normal Neck supple with no adenopathy JVP normal no bruits no thyromegaly Lungs clear with no wheezing and good diaphragmatic motion Heart:  S1/S2 no murmur, no rub, gallop or click PMI normal Abdomen: benighn, BS positve, no tenderness, no AAA no bruit.  No HSM or HJR Distal pulses intact with no bruits No edema Neuro non-focal Skin warm and dry No muscular weakness   Wt Readings from Last 3 Encounters:  02/22/18 223 lb 6.4 oz (101.3 kg)  11/04/17 210 lb (95.3 kg)  07/04/17 204 lb (92.5 kg)      Studies/Labs Reviewed:     EKG:  EKG is  ordered today.  02/22/18 SR rate 66 normal   Recent Labs: 07/04/2017: ALT 19; BUN 14; Creatinine, Ser 0.82; Hemoglobin 15.5; Platelets 249; Potassium 3.7; Sodium 135  Labs 11/14/15 (from PCP): K 4.5, creatinine 0.93, ALT 26, TC  179, HDL 45, LDL 110, Hgb 16.2, TSH 1.617  Recent Lipid Panel No results found for: CHOL, TRIG, HDL, CHOLHDL, VLDL, LDLCALC, LDLDIRECT  Additional studies/ records that were reviewed today include:   Cardiac CT 3/16 IMPRESSION:  Coronary calcium score of 0.   ASSESSMENT:     1. Essential hypertension   2. Dyslipidemia     PLAN:     In order of problems listed above:  1. Elevated BP -  Well controlled.  Continue current medications and low sodium Dash type diet.    2. FHx of CAD - Ca score 0 2016 update 02/17/18 still 0 .  No angina.  Continue ASA, statin.      3. Dyslipidemia -  Given family history ans some aortic atherosclerosis on CT will increase zocor to 10 mg and target LDL 75 or less He will get repeat labs in 3 months at work   4. Thyroid: on replacement TSH normal   He will have his latest labs faxed to [place]   [employer]

## 2018-02-17 ENCOUNTER — Ambulatory Visit (INDEPENDENT_AMBULATORY_CARE_PROVIDER_SITE_OTHER)
Admission: RE | Admit: 2018-02-17 | Discharge: 2018-02-17 | Disposition: A | Discharge status: Home / Self Care | Payer: Self-pay | Source: Ambulatory Visit | Attending: Cardiovascular Disease | Admitting: Cardiovascular Disease

## 2018-02-17 DIAGNOSIS — I10 Essential (primary) hypertension: Secondary | ICD-10-CM

## 2018-02-17 DIAGNOSIS — E785 Hyperlipidemia, unspecified: Secondary | ICD-10-CM

## 2018-02-22 ENCOUNTER — Encounter: Payer: Self-pay | Admitting: Cardiovascular Disease

## 2018-02-22 ENCOUNTER — Ambulatory Visit: Payer: Federal, State, Local not specified - PPO | Admitting: Cardiovascular Disease

## 2018-02-22 VITALS — BP 126/88 | HR 66 | Ht 65.0 in | Wt 223.4 lb

## 2018-02-22 DIAGNOSIS — E785 Hyperlipidemia, unspecified: Secondary | ICD-10-CM | POA: Diagnosis not present

## 2018-02-22 DIAGNOSIS — I10 Essential (primary) hypertension: Secondary | ICD-10-CM | POA: Diagnosis not present

## 2018-02-22 MED ORDER — SIMVASTATIN 10 MG PO TABS: 10.0000 mg | ORAL_TABLET | Freq: Every day | ORAL | 3 refills | Status: DC

## 2018-02-22 NOTE — Patient Instructions (Addendum)
Medication Instructions:  Your physician has recommended you make the following change in your medication:  1-Increase Simvastatin (zocor) to 10 mg by mouth daily   Labwork: NONE  Testing/Procedures: NONE  Follow-Up: Your physician wants you to follow-up in: 12 months with Dr. Johnsie Cancel. You will receive a reminder letter in the mail two months in advance. If you don't receive a letter, please call our office to schedule the follow-up appointment.   If you need a refill on your cardiac medications before your next appointment, please call your pharmacy.

## 2018-03-05 ENCOUNTER — Other Ambulatory Visit: Payer: Self-pay | Admitting: Family Medicine

## 2018-03-05 ENCOUNTER — Other Ambulatory Visit: Payer: Self-pay | Admitting: Family

## 2018-03-05 DIAGNOSIS — E049 Nontoxic goiter, unspecified: Secondary | ICD-10-CM

## 2018-04-07 ENCOUNTER — Other Ambulatory Visit: Payer: Self-pay | Admitting: Cardiovascular Disease

## 2018-04-07 DIAGNOSIS — I10 Essential (primary) hypertension: Secondary | ICD-10-CM

## 2018-05-25 ENCOUNTER — Ambulatory Visit
Admission: RE | Admit: 2018-05-25 | Discharge: 2018-05-25 | Disposition: A | Discharge status: Home / Self Care | Payer: Federal, State, Local not specified - PPO | Source: Ambulatory Visit | Attending: Family Medicine | Admitting: Family Medicine

## 2018-05-25 DIAGNOSIS — E049 Nontoxic goiter, unspecified: Secondary | ICD-10-CM

## 2019-02-03 ENCOUNTER — Telehealth: Payer: Self-pay | Admitting: *Deleted

## 2019-02-03 NOTE — Telephone Encounter (Signed)
   [facility] Pre-operative Risk Assessment    Request for surgical clearance:  1. What type of surgery is being performed? LEFT TOTAL HIP   2. When is this surgery scheduled? 03/22/19   3. What type of clearance is required (medical clearance vs. Pharmacy clearance to hold med vs. Both)? MEDICAL  4. Are there any medications that need to be held prior to surgery and how long?NONE LISTED   5. Practice name and name of physician performing surgery? [facility]; DR. Alvan Dame   6. What is your office phone number [phone]    7.   What is your office fax number [phone]  8.   Anesthesia type (None, local, MAC, general) ? SPINAL   Tanner Harrison 02/03/2019, 4:20 PM  _________________________________________________________________   (provider comments below)

## 2019-02-04 NOTE — Telephone Encounter (Signed)
   Primary Cardiologist: Jenkins Rouge, MD  Chart reviewed as part of pre-operative protocol coverage. Patient was contacted 02/04/2019 in reference to pre-operative risk assessment for pending surgery as outlined below.  Tanner Harrison was last seen on 02/22/18 by Dr. Johnsie Cancel.  Since that day, Tanner Harrison has done well. He denies any changes to his medical history and denies anginal symptoms.   Therefore, based on ACC/AHA guidelines, the patient would be at acceptable risk for the planned procedure without further cardiovascular testing.   I will route this recommendation to the requesting party via Epic fax function and remove from pre-op pool.  Please call with questions.  Tami Lin Duke, PA 02/04/2019, 11:04 AM

## 2019-02-04 NOTE — Telephone Encounter (Signed)
   Primary Cardiologist: Jenkins Rouge, MD  Chart reviewed as part of pre-operative protocol coverage. His surgery is scheduled for 03/22/19. He has an appt with Dr. Johnsie Cancel on 02/22/19. I will ask Dr. Johnsie Cancel to provide recommendations for proceeding with surgery.   I will route this recommendation to the requesting party via Epic fax function and remove from pre-op pool.  Please call with questions.  Tami Lin Duke, PA 02/04/2019, 9:44 AM

## 2019-02-04 NOTE — Telephone Encounter (Signed)
Angie- why in the world could you not have just cleared him for surgery ?? He has no CAD and a calcium score of 0 I've been getting a bunch of these from PA;s and although I appreciate you guys going through all these preop's your not saving [place] very much work if no decisions are being made and all of them are being referred back to [place]

## 2019-02-18 NOTE — Progress Notes (Signed)
Cardiology Office Note:    Date:  02/22/2019   ID:  Tanner Harrison, DOB 10-23-1971, MRN [id number]  PCP:  Karle Starch, MD  Cardiologist:  Dr. Jenkins Rouge   Electrophysiologist:  n/a  No chief complaint on file.   History of Present Illness:     Tanner Harrison is a 48 y.o. male with a hx of family history of premature CAD (father with MI late 26s, mom with heart valve replacement in her 47s), hypothyroidism, dyslipidemia. He was evaluated by me  in 2/16. Cardiac CT demonstrated calcium score 0.   Returns for FU. Doing well. He tries to run 1-2 times a week.  He denies chest pain, dyspnea, syncope, orthopnea, PND, edema.  He snores.  His wife has not noticed any apnea.  He denies daytime hypersomnolence.    Updated Calcium Score 02/17/18  0  No chest pain palpitations or dyspnea   Kids 10/12  playing soccor Reviewed labs from work nurse [employer]  LDL 140 A1c5.6 TSH 2.1   He will have total  Left hip arthroplasty with Dr Alvan Dame in March 2020 Will be done with spinal  No cardiac issues ECG today totally normal   Past Medical History:  Diagnosis Date  . Headache    hx of as teen  . Heart murmur    hx of  . Hemorrhoids   . Hyperlipidemia   . Hypertension   . Hypothyroid   . Low TSH level   . Pneumonia    20 years ago    Past Surgical History:  Procedure Laterality Date  . colonocsopy     2003 11/04/17  . COLONOSCOPY WITH PROPOFOL N/A 11/04/2017   Procedure: COLONOSCOPY WITH PROPOFOL DIAGNOSTIC;  Surgeon: Leighton Ruff, MD;  Location: [facility];  Service: Endoscopy;  Laterality: N/A;  . HERNIA REPAIR     x2 bil inguinal, and 2007 L inguinal repaired  . Left hand cyst removed    . MOUTH SURGERY     teeth extraction    Current Medications: Outpatient Medications Prior to Visit  Medication Sig Dispense Refill  . albuterol (PROVENTIL HFA;VENTOLIN HFA) 108 (90 BASE) MCG/ACT inhaler Inhale 2 puffs into the lungs every 6 (six) hours as needed for  wheezing or shortness of breath.    . anastrozole (ARIMIDEX) 1 MG tablet Take 0.5 tablets by mouth once a week. ONLY ON WEDNESDAYS    . Ascorbic Acid (VITAMIN C) POWD Take 1,000 mg daily by mouth.    Marland Kitchen aspirin EC 81 MG tablet Take 81 mg by mouth daily.    Marland Kitchen DHEA 50 MG TABS Take 50 mg daily by mouth.    Marland Kitchen ketotifen (ALAWAY) 0.025 % ophthalmic solution Place 1 drop daily as needed into both eyes (allergies).    Marland Kitchen levothyroxine (SYNTHROID, LEVOTHROID) 112 MCG tablet Take 112 mcg by mouth daily.  0  . OVER THE COUNTER MEDICATION Take 1 tablet daily with lunch by mouth. Glucose Optimizer OTC supplement    . sildenafil (REVATIO) 20 MG tablet Take 20-100 mg daily as needed by mouth (erectile dysfunction).    . simvastatin (ZOCOR) 5 MG tablet Take 5 mg by mouth daily.    Marland Kitchen testosterone cypionate (DEPOTESTOSTERONE CYPIONATE) 200 MG/ML injection Inject 0.5 mLs into the muscle every 14 (fourteen) days.   1  . thyroid (ARMOUR) 15 MG tablet Take 15 mg See admin instructions by mouth. Take 15 mg daily by mouth on Tue, Thur, Sat, and Sun.  Take 30 mg once  daily on Mon, Wed, and Fri    . triamterene-hydrochlorothiazide (MAXZIDE-25) 37.5-25 MG tablet TAKE 1 TABLET BY MOUTH DAILY 90 tablet 3  . UNABLE TO FIND Take 20 mg by mouth daily. Med Name: Pregnenolone    . cetirizine (ZYRTEC) 10 MG tablet Take 10 mg daily as needed by mouth for allergies.    . Cholecalciferol (VITAMIN D3) 5000 units CAPS Take 5,000 Units daily by mouth.    . Magnesium Citrate POWD Take 160 mg daily by mouth.    . Misc Natural Products (PROSTATE SUPPORT PO) Take 2 tablets daily by mouth.    . psyllium (REGULOID) 0.52 g capsule Take 5 capsules 2 (two) times daily by mouth.    . ranitidine (ZANTAC) 75 MG tablet Take 75-150 mg daily as needed by mouth for heartburn.    . simvastatin (ZOCOR) 10 MG tablet Take 1 tablet (10 mg total) by mouth daily. 90 tablet 3   No facility-administered medications prior to visit.      Allergies:   Patient  has no known allergies.   Social History   Socioeconomic History  . Marital status: Married    Spouse name: Not on file  . Number of children: Not on file  . Years of education: Not on file  . Highest education level: Not on file  Occupational History  . Occupation: tax Printmaker: [employer]  Social Needs  . Financial resource strain: Not on file  . Food insecurity:    Worry: Not on file    Inability: Not on file  . Transportation needs:    Medical: Not on file    Non-medical: Not on file  Tobacco Use  . Smoking status: Former Smoker    Last attempt to quit: 1998    Years since quitting: 22.1  . Smokeless tobacco: Never Used  Substance and Sexual Activity  . Alcohol use: No    Alcohol/week: 0.0 standard drinks  . Drug use: No  . Sexual activity: Yes  Lifestyle  . Physical activity:    Days per week: Not on file    Minutes per session: Not on file  . Stress: Not on file  Relationships  . Social connections:    Talks on phone: Not on file    Gets together: Not on file    Attends religious service: Not on file    Active member of club or organization: Not on file    Attends meetings of clubs or organizations: Not on file    Relationship status: Not on file  Other Topics Concern  . 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   ROS:   Please see the history of present illness.    Review of Systems  Respiratory: Positive for snoring.    All other systems reviewed and are negative.   Physical Exam:    VS:  BP 128/90   Pulse 70   Ht 5\' 5"  (1.651 m)   Wt 102.6 kg   SpO2 98%   BMI 37.63 kg/m    Affect appropriate Healthy:  appears stated age HEENT: normal Neck supple with  no adenopathy JVP normal  not  bruits no thyromegaly Lungs clear with no wheezing and good diaphragmatic motion Heart:  S1/S2 no murmur, no rub, gallop or click PMI normal Abdomen: benighn, BS positve, no tenderness, no AAA no bruit.  No HSM or HJR Distal pulses intact with no bruits No edema Neuro non-focal Skin warm and dry No muscular weakness   Wt Readings from Last 3 Encounters:  2019/03/13 102.6 kg  02/22/18 101.3 kg  11/04/17 95.3 kg      Studies/Labs Reviewed:     EKG:  03-13-2019 SR rate 70 normal   Recent Labs: No results found for requested labs within last 8760 hours.  Labs 11/14/15 (from PCP): K 4.5, creatinine 0.93, ALT 26, TC 179, HDL 45, LDL 110, Hgb 16.2, TSH 1.617  Recent Lipid Panel No results found for: CHOL, TRIG, HDL, CHOLHDL, VLDL, LDLCALC, LDLDIRECT  Additional studies/ records that were reviewed today include:   Cardiac CT 3/16 IMPRESSION: Coronary calcium score of 0.   ASSESSMENT:     1. Essential hypertension   2. Dyslipidemia   3. Family history of early CAD   8. Pre-operative clearance     PLAN:     In order of problems listed above:  1. HTN -  Well controlled.  Continue current medications and low sodium Dash type diet.    2. FHx of CAD - Ca score  02/17/18 still 0 .  No angina.  Continue ASA, statin.      3. HLD   Given family history ans some aortic atherosclerosis on CT  zocor increased to 10 mg and target LDL 75 or less He will get repeat labs  at work   4. Thyroid: on replacement TSH normal   5. Ortho:  Clear to have left THR with Dr Alvan Dame March   He will have his latest labs faxed to [place]   [facility]

## 2019-02-22 ENCOUNTER — Encounter: Payer: Self-pay | Admitting: Cardiovascular Disease

## 2019-02-22 ENCOUNTER — Ambulatory Visit: Payer: Federal, State, Local not specified - PPO | Admitting: Cardiovascular Disease

## 2019-02-22 VITALS — BP 128/90 | HR 70 | Ht 65.0 in | Wt 226.1 lb

## 2019-02-22 DIAGNOSIS — I10 Essential (primary) hypertension: Secondary | ICD-10-CM

## 2019-02-22 DIAGNOSIS — Z8249 Family history of ischemic heart disease and other diseases of the circulatory system: Secondary | ICD-10-CM | POA: Diagnosis not present

## 2019-02-22 DIAGNOSIS — Z01818 Encounter for other preprocedural examination: Secondary | ICD-10-CM | POA: Diagnosis not present

## 2019-02-22 DIAGNOSIS — E785 Hyperlipidemia, unspecified: Secondary | ICD-10-CM

## 2019-02-22 NOTE — Patient Instructions (Addendum)

## 2019-03-22 ENCOUNTER — Inpatient Hospital Stay (HOSPITAL_COMMUNITY): Admit: 2019-03-22 | Payer: Federal, State, Local not specified - PPO | Admitting: Orthopedic Surgery

## 2019-03-22 ENCOUNTER — Encounter (HOSPITAL_COMMUNITY): Payer: Self-pay

## 2019-03-22 SURGERY — ARTHROPLASTY, HIP, TOTAL, ANTERIOR APPROACH
Anesthesia: Spinal | Laterality: Left

## 2019-04-02 ENCOUNTER — Other Ambulatory Visit: Payer: Self-pay | Admitting: Cardiovascular Disease

## 2019-04-02 DIAGNOSIS — I10 Essential (primary) hypertension: Secondary | ICD-10-CM

## 2019-07-12 ENCOUNTER — Telehealth: Payer: Self-pay | Admitting: Cardiovascular Disease

## 2019-07-12 NOTE — Telephone Encounter (Signed)
New Message      Mickel Baas from [facility] is requesting the EKG from the last visit to be sent to them. She says they need the picture of the EKG  Fax number [phone]

## 2019-07-12 NOTE — Telephone Encounter (Signed)
Spoke with our medical records dept and I will send this message to their dept, to follow-up with the requesting Physicians office, asking for this pts last EKG to be faxed to their office.

## 2020-03-26 NOTE — Progress Notes (Signed)
Virtual Visit via Video Note   This visit type was conducted due to national recommendations for restrictions regarding the COVID-19 Pandemic (e.g. social distancing) in an effort to limit this patient's exposure and mitigate transmission in our community.  Due to her co-morbid illnesses, this patient is at least at moderate risk for complications without adequate follow up.  This format is felt to be most appropriate for this patient at this time.  All issues noted in this document were discussed and addressed.  A limited physical exam was performed with this format.  Please refer to the patient's chart for her consent to telehealth for [facility].   Patient Location Home Physician Location: Office  Cardiology Office Note:    Date:  04/02/2020   ID:  Tanner Harrison, DOB 30-Jan-1971, MRN [id number]  PCP:  Karle Starch, MD  Cardiologist:  Dr. Jenkins Rouge   Electrophysiologist:  n/a   History of Present Illness:     Tanner Harrison is a 49 y.o. male with a hx of family history of premature CAD (father with MI late 48s, mom with heart valve replacement in her 76s), hypothyroidism, dyslipidemia. He was evaluated by me  in 2/16. Cardiac CT demonstrated calcium score 0.   Returns for FU. Doing well. He tries to run 1-2 times a week.  He denies chest pain, dyspnea, syncope, orthopnea, PND, edema.  He snores.  His wife has not noticed any apnea.  He denies daytime hypersomnolence.    Updated Calcium Score 02/17/18  0   No chest pain palpitations or dyspnea   Kids  11/13   playing soccer  Reviewed labs from work nurse [employer]  LDL 140 A1c5.6 TSH 2.1   No cardiac issues ECG today totally normal   Had cholecystectomy February [id number] complicated by CBD stone Having 2 nd ERCP this week Discussed holding statin until resolved  Chronic left hip pain Dr Rosanna Randy operated on July 2020  Past Medical History:  Diagnosis Date  . Headache    hx of as teen  . Heart murmur    hx of   . Hemorrhoids   . Hyperlipidemia   . Hypertension   . Hypothyroid   . Low TSH level   . Pneumonia    20 years ago    Past Surgical History:  Procedure Laterality Date  . colonocsopy     2003 11/04/17  . COLONOSCOPY WITH PROPOFOL N/A 11/04/2017   Procedure: COLONOSCOPY WITH PROPOFOL DIAGNOSTIC;  Surgeon: Leighton Ruff, MD;  Location: [facility];  Service: Endoscopy;  Laterality: N/A;  . HERNIA REPAIR     x2 bil inguinal, and 2007 L inguinal repaired  . Left hand cyst removed    . MOUTH SURGERY     teeth extraction    Current Medications: Outpatient Medications Prior to Visit  Medication Sig Dispense Refill  . albuterol (PROVENTIL HFA;VENTOLIN HFA) 108 (90 BASE) MCG/ACT inhaler Inhale 2 puffs into the lungs every 6 (six) hours as needed for wheezing or shortness of breath.    . anastrozole (ARIMIDEX) 1 MG tablet Take 0.5 tablets by mouth once a week. ONLY ON WEDNESDAYS    . Ascorbic Acid (VITAMIN C) POWD Take 1,000 mg daily by mouth.    Marland Kitchen aspirin EC 81 MG tablet Take 81 mg by mouth daily.    Marland Kitchen DHEA 50 MG TABS Take 50 mg daily by mouth.    Marland Kitchen ketotifen (ALAWAY) 0.025 % ophthalmic solution Place 1 drop daily as needed into both  eyes (allergies).    Marland Kitchen levothyroxine (SYNTHROID, LEVOTHROID) 112 MCG tablet Take 112 mcg by mouth daily.  0  . OVER THE COUNTER MEDICATION Take 1 tablet daily with lunch by mouth. Glucose Optimizer OTC supplement    . PRASTERONE, DHEA, PO Take by mouth.    . Pregnenolone POWD pregnenolone (bulk) powder    . sildenafil (REVATIO) 20 MG tablet Take 20-100 mg daily as needed by mouth (erectile dysfunction).    . simvastatin (ZOCOR) 5 MG tablet Take 5 mg by mouth daily.    Marland Kitchen testosterone cypionate (DEPOTESTOSTERONE CYPIONATE) 200 MG/ML injection Inject 0.5 mLs into the muscle every 14 (fourteen) days.   1  . thyroid (ARMOUR) 15 MG tablet Take 15 mg See admin instructions by mouth. Take 15 mg daily by mouth on Tue, Thur, Sat, and Sun.  Take 30 mg once daily on  Mon, Wed, and Fri    . triamterene-hydrochlorothiazide (MAXZIDE-25) 37.5-25 MG tablet TAKE 1 TABLET BY MOUTH DAILY 90 tablet 3  . UNABLE TO FIND Take 20 mg by mouth daily. Med Name: Pregnenolone     No facility-administered medications prior to visit.     Allergies:   Patient has no known allergies.   Social History   Socioeconomic History  . Marital status: Married    Spouse name: Not on file  . Number of children: Not on file  . Years of education: Not on file  . Highest education level: Not on file  Occupational History  . Occupation: tax Printmaker: [employer]  Tobacco Use  . Smoking status: Former Smoker    Quit date: 1998    Years since quitting: 23.2  . Smokeless tobacco: Never Used  Substance and Sexual Activity  . Alcohol use: No    Alcohol/week: 0.0 standard drinks  . Drug use: No  . Sexual activity: Yes  Other Topics Concern  . Not on file  Social History Narrative   Tax Administrator with [employer]   Married   4 children - 3 living; Daughter married and lives in [place], [place]   Social Determinants of Health   Financial Resource Strain:   . Difficulty of Paying Living Expenses:   Food Insecurity:   . Worried About Charity fundraiser in the Last Year:   . Arboriculturist in the Last Year:   Transportation Needs:   . Film/video editor (Medical):   Marland Kitchen Lack of Transportation (Non-Medical):   Physical Activity:   . Days of Exercise per Week:   . Minutes of Exercise per Session:   Stress:   . Feeling of Stress :   Social Connections:   . Frequency of Communication with Friends and Family:   . Frequency of Social Gatherings with Friends and Family:   . Attends Religious Services:   . Active Member of Clubs or Organizations:   . Attends Archivist Meetings:   Marland Kitchen Marital Status:      Family History:  The patient's family history includes Heart Problems (age of onset: 39) in his mother; Heart attack (age of onset: 46)  in his father.   ROS:   Please see the history of present illness.    Review of Systems  Respiratory: Positive for snoring.    All other systems reviewed and are negative.   Physical Exam:    VS:  Pulse 76   Ht 5\' 6"  (1.676 m)   Wt 208 lb 9.6 oz (94.6  kg)   SpO2 96%   BMI 33.67 kg/m    No distress No tachypnea No JVP elevation No edema   Wt Readings from Last 3 Encounters:  04/02/20 208 lb 9.6 oz (94.6 kg)  2019/02/25 226 lb 1.9 oz (102.6 kg)  02/22/18 223 lb 6.4 oz (101.3 kg)      Studies/Labs Reviewed:     EKG:  2019-02-25 SR rate 70 normal   Recent Labs: No results found for requested labs within last 8760 hours.  Labs 11/14/15 (from PCP): K 4.5, creatinine 0.93, ALT 26, TC 179, HDL 45, LDL 110, Hgb 16.2, TSH 1.617  Recent Lipid Panel No results found for: CHOL, TRIG, HDL, CHOLHDL, VLDL, LDLCALC, LDLDIRECT  Additional studies/ records that were reviewed today include:   Cardiac CT 3/16 IMPRESSION: Coronary calcium score of 0.   ASSESSMENT:     Family History CAD/HTN  PLAN:     In order of problems listed above:  1. HTN -  Well controlled.  Continue current medications and low sodium Dash type diet.    2. FHx of CAD - Ca score  02/17/18 still 0 .  No angina.  Continue ASA, statin.      3. HLD   On statin  Labs with primary   4. Thyroid: on replacement TSH normal   5. Ortho:  Post left hip replacement improved F/U Dr Rosanna Randy [facility]   6. GI:  Post gallbladder surgery needs 2nd ERCP latter this week to make sure CBD open Told him to hold statin until resolved   He will have his latest labs faxed to [place]  Time spent reviewing chart, cardiac CT, [facility] notes and labs direct patient interview and writing note 20 minutes    Jenkins Rouge

## 2020-04-02 ENCOUNTER — Telehealth (INDEPENDENT_AMBULATORY_CARE_PROVIDER_SITE_OTHER): Payer: Federal, State, Local not specified - PPO | Admitting: Cardiovascular Disease

## 2020-04-02 ENCOUNTER — Encounter: Payer: Self-pay | Admitting: Cardiovascular Disease

## 2020-04-02 VITALS — HR 76 | Ht 66.0 in | Wt 208.6 lb

## 2020-04-02 DIAGNOSIS — Z96642 Presence of left artificial hip joint: Secondary | ICD-10-CM

## 2020-04-02 DIAGNOSIS — I10 Essential (primary) hypertension: Secondary | ICD-10-CM | POA: Diagnosis not present

## 2020-04-02 DIAGNOSIS — Z8249 Family history of ischemic heart disease and other diseases of the circulatory system: Secondary | ICD-10-CM

## 2020-04-02 DIAGNOSIS — E785 Hyperlipidemia, unspecified: Secondary | ICD-10-CM | POA: Diagnosis not present

## 2020-04-02 NOTE — Patient Instructions (Addendum)
Medication Instructions:  *If you need a refill on your cardiac medications before your next appointment, please call your pharmacy*  Lab Work: If you have labs (blood work) drawn today and your tests are completely normal, you will receive your results only by: Marland Kitchen MyChart Message (if you have MyChart) OR . A paper copy in the mail If you have any lab test that is abnormal or we need to change your treatment, we will call you to review the results.  Testing/Procedures: Your provider recommends you get a CT for calcium score in one year.  Follow-Up: At [facility], you and your health needs are our priority.  As part of our continuing mission to provide you with exceptional heart care, we have created designated Provider Care Teams.  These Care Teams include your primary Cardiologist (physician) and Advanced Practice Providers (APPs -  Physician Assistants and Nurse Practitioners) who all work together to provide you with the care you need, when you need it.  We recommend signing up for the patient portal called "MyChart".  Sign up information is provided on this After Visit Summary.  MyChart is used to connect with patients for Virtual Visits (Telemedicine).  Patients are able to view lab/test results, encounter notes, upcoming appointments, etc.  Non-urgent messages can be sent to your provider as well.   To learn more about what you can do with MyChart, go to [email or web address].    Your next appointment:   12 month(s)  The format for your next appointment:   Either In Person or Virtual  Provider:   You may see Jenkins Rouge, MD or one of the following Advanced Practice Providers on your designated Care Team:    Truitt Merle, NP  Cecilie Kicks, NP  Kathyrn Drown, NP

## 2020-04-13 ENCOUNTER — Other Ambulatory Visit: Payer: Self-pay | Admitting: Cardiovascular Disease

## 2020-04-13 DIAGNOSIS — I10 Essential (primary) hypertension: Secondary | ICD-10-CM

## 2021-01-09 ENCOUNTER — Other Ambulatory Visit: Payer: Self-pay | Admitting: Cardiovascular Disease

## 2021-01-09 DIAGNOSIS — I10 Essential (primary) hypertension: Secondary | ICD-10-CM

## 2021-02-01 ENCOUNTER — Other Ambulatory Visit: Payer: Self-pay

## 2021-02-01 ENCOUNTER — Ambulatory Visit (INDEPENDENT_AMBULATORY_CARE_PROVIDER_SITE_OTHER)
Admission: RE | Admit: 2021-02-01 | Discharge: 2021-02-01 | Disposition: A | Discharge status: Home / Self Care | Payer: Self-pay | Source: Ambulatory Visit | Attending: Cardiovascular Disease | Admitting: Cardiovascular Disease

## 2021-02-01 DIAGNOSIS — I10 Essential (primary) hypertension: Secondary | ICD-10-CM

## 2021-02-01 DIAGNOSIS — Z8249 Family history of ischemic heart disease and other diseases of the circulatory system: Secondary | ICD-10-CM

## 2021-03-21 NOTE — Progress Notes (Signed)
Cardiology Office Note:    Date:  03/29/2021   ID:  Tanner Harrison, DOB Jul 08, 1971, MRN [id number]  PCP:  Tanner Starch, MD  Cardiologist:  Dr. Jenkins Harrison   Electrophysiologist:  n/a   History of Present Illness:     Tanner Harrison is a 50 y.o. male  family history of premature CAD (father with MI late 2s, mom with heart valve replacement in her 72s), hypothyroidism, dyslipidemia. He was evaluated by me  in 2/16. Cardiac CT demonstrated calcium score 0.   Returns for FU. Doing well. He tries to run 1-2 times a week.  He denies chest pain, dyspnea, syncope, orthopnea, PND, edema.  He snores.  His wife has not noticed any apnea.  He denies daytime hypersomnolence.    Updated Calcium Score 02/17/18  0   No chest pain palpitations or dyspnea   Kids  12/14   playing soccer  Reviewed labs from work nurse [employer]  LDL 140 A1c5.6 TSH 2.1   No cardiac issues ECG today totally normal   Had cholecystectomy February 4010 complicated by CBD stone and ERCP x 2  Chronic left hip pain Dr Tanner Harrison operated on July 2020  LDL 165 on zocor low dose Asked to change to lipitor 20 mg daily   Past Medical History:  Diagnosis Date  . Headache    hx of as teen  . Heart murmur    hx of  . Hemorrhoids   . Hyperlipidemia   . Hypertension   . Hypothyroid   . Low TSH level   . Pneumonia    20 years ago    Past Surgical History:  Procedure Laterality Date  . colonocsopy     2003 11/04/17  . COLONOSCOPY WITH PROPOFOL N/A 11/04/2017   Procedure: COLONOSCOPY WITH PROPOFOL DIAGNOSTIC;  Surgeon: Leighton Ruff, MD;  Location: [facility];  Service: Endoscopy;  Laterality: N/A;  . HERNIA REPAIR     x2 bil inguinal, and 2007 L inguinal repaired  . Left hand cyst removed    . MOUTH SURGERY     teeth extraction    Current Medications: Outpatient Medications Prior to Visit  Medication Sig Dispense Refill  . albuterol (PROVENTIL HFA;VENTOLIN HFA) 108 (90 BASE) MCG/ACT  inhaler Inhale 2 puffs into the lungs every 6 (six) hours as needed for wheezing or shortness of breath.    . anastrozole (ARIMIDEX) 1 MG tablet Take 0.5 tablets by mouth once a week. ONLY ON WEDNESDAYS    . Ascorbic Acid (VITAMIN C) POWD Take 1,000 mg daily by mouth.    Marland Kitchen aspirin EC 81 MG tablet Take 81 mg by mouth daily.    Marland Kitchen atorvastatin (LIPITOR) 20 MG tablet Take 1 tablet (20 mg total) by mouth daily. 90 tablet 3  . DHEA 50 MG TABS Take 50 mg daily by mouth.    Marland Kitchen ketotifen (ZADITOR) 0.025 % ophthalmic solution Place 1 drop daily as needed into both eyes (allergies).    Marland Kitchen levothyroxine (SYNTHROID, LEVOTHROID) 112 MCG tablet Take 112 mcg by mouth daily.  0  . OVER THE COUNTER MEDICATION Take 1 tablet by mouth daily with lunch. Glucose Optimizer OTC supplement    . Pregnenolone POWD pregnenolone (bulk) powder    . testosterone cypionate (DEPOTESTOSTERONE CYPIONATE) 200 MG/ML injection Inject 0.5 mLs into the muscle every 14 (fourteen) days.   1  . thyroid (ARMOUR) 15 MG tablet Take 15 mg See admin instructions by mouth. Take 15 mg daily  by mouth on Tue, Thur, Sat, and Sun.  Take 30 mg once daily on Mon, Wed, and Fri    . triamterene-hydrochlorothiazide (MAXZIDE-25) 37.5-25 MG tablet TAKE 1 TABLET BY MOUTH DAILY 90 tablet 3  . UNABLE TO FIND Take 20 mg by mouth daily. Med Name: Pregnenolone    . PRASTERONE, DHEA, PO Take by mouth.    . sildenafil (REVATIO) 20 MG tablet Take 20-100 mg daily as needed by mouth (erectile dysfunction).     No facility-administered medications prior to visit.     Allergies:   Gluten meal   Social History   Socioeconomic History  . Marital status: Married    Spouse name: Not on file  . Number of children: Not on file  . Years of education: Not on file  . Highest education level: Not on file  Occupational History  . Occupation: tax Printmaker: [employer]  Tobacco Use  . Smoking status: Former Smoker    Quit date: 1998    Years since  quitting: 24.2  . Smokeless tobacco: Never Used  Vaping Use  . Vaping Use: Never used  Substance and Sexual Activity  . Alcohol use: No    Alcohol/week: 0.0 standard drinks  . Drug use: No  . Sexual activity: Yes  Other Topics Concern  . 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   ROS:   Please see the history of present illness.    Review of Systems  Respiratory: Positive for snoring.    All other systems reviewed and are negative.   Physical Exam:    VS:  BP (!) 130/94   Pulse 75   Ht 5\' 6"  (1.676 m)   Wt 102.1 kg   SpO2 96%   BMI 36.32 kg/m    Affect appropriate Healthy:  appears stated age HEENT: normal Neck supple with no adenopathy JVP normal no bruits no thyromegaly Lungs clear with no wheezing and good diaphragmatic motion Heart:  S1/S2 no murmur, no rub, gallop or click PMI normal Abdomen: benighn, BS positve, no tenderness, no AAA no bruit.  No HSM or HJR Distal pulses intact with no bruits No edema Neuro non-focal Skin warm and dry Post left THR     Wt Readings from Last 3 Encounters:  03/29/21 102.1 kg  04/02/20 94.6 kg  2019-03-11 102.6 kg      Studies/Labs Reviewed:     EKG:  03/11/19 SR rate 70 normal 03/29/2021 NSR rate 73 nonspecific ST changes PVC   Recent Labs: No results found for requested labs within last 8760 hours.  Labs 11/14/15 (from PCP): K 4.5, creatinine 0.93, ALT 26, TC 179, HDL 45, LDL 110, Hgb 16.2, TSH 1.617  Recent Lipid  Panel No results found for: CHOL, TRIG, HDL, CHOLHDL, VLDL, LDLCALC, LDLDIRECT  Additional studies/ records that were reviewed today include:   Cardiac CT 3/16 IMPRESSION: Coronary calcium score of 0.  Cardiac Calcium Score 02/01/21 Score 0   ASSESSMENT:     Family History CAD/HTN  PLAN:     In order of problems listed above:  1. HTN -  Borderline he will get BP cuff and record Likely will need ARB added to diuretic  2. FHx of CAD - Ca score  02/01/21  still 0 .  No angina.  Continue ASA, statin.      3. HLD   Start lipitor 20 mg daily f/u labs 3 months His LFTls had returned to normal after ERCP in November 2021   4. Thyroid: on replacement TSH normal   5. Ortho:  Post left hip replacement improved F/U Dr Tanner Harrison [facility]   6. GI:  Post gallbladder surgery and ERCP x 3    F/U 3 months  Lipid /Liver then Lipitor 20 mg daily   F/U in a year   [employer]

## 2021-03-25 MED ORDER — ATORVASTATIN CALCIUM 20 MG PO TABS: 20.0000 mg | ORAL_TABLET | Freq: Every day | ORAL | 3 refills | Status: DC

## 2021-03-29 ENCOUNTER — Encounter: Payer: Self-pay | Admitting: Cardiovascular Disease

## 2021-03-29 ENCOUNTER — Other Ambulatory Visit: Payer: Self-pay

## 2021-03-29 ENCOUNTER — Ambulatory Visit: Payer: Federal, State, Local not specified - PPO | Admitting: Cardiovascular Disease

## 2021-03-29 VITALS — BP 130/94 | HR 75 | Ht 66.0 in | Wt 225.0 lb

## 2021-03-29 DIAGNOSIS — E785 Hyperlipidemia, unspecified: Secondary | ICD-10-CM

## 2021-03-29 DIAGNOSIS — I10 Essential (primary) hypertension: Secondary | ICD-10-CM

## 2021-03-29 NOTE — Patient Instructions (Addendum)
Medication Instructions:  *If you need a refill on your cardiac medications before your next appointment, please call your pharmacy*  Lab Work: Your physician recommends that you return for lab work in: 3 months for fasting lipid and liver panel.   If you have labs (blood work) drawn today and your tests are completely normal, you will receive your results only by: Marland Kitchen MyChart Message (if you have MyChart) OR . A paper copy in the mail If you have any lab test that is abnormal or we need to change your treatment, we will call you to review the results.  Testing/Procedures: None ordered today.   Follow-Up: At [facility], you and your health needs are our priority.  As part of our continuing mission to provide you with exceptional heart care, we have created designated Provider Care Teams.  These Care Teams include your primary Cardiologist (physician) and Advanced Practice Providers (APPs -  Physician Assistants and Nurse Practitioners) who all work together to provide you with the care you need, when you need it.  We recommend signing up for the patient portal called "MyChart".  Sign up information is provided on this After Visit Summary.  MyChart is used to connect with patients for Virtual Visits (Telemedicine).  Patients are able to view lab/test results, encounter notes, upcoming appointments, etc.  Non-urgent messages can be sent to your provider as well.   To learn more about what you can do with MyChart, go to [email or web address].    Your next appointment:   3 month(s)  The format for your next appointment:   In Person  Provider:   You may see Jenkins Rouge, MD or one of the following Advanced Practice Providers on your designated Care Team:    Kathyrn Drown, NP

## 2021-05-20 IMAGING — CT CT CARDIAC CORONARY ARTERY CALCIUM SCORE
3 series · 14 of 20 positions shown, 15 images · non-contrast
Comparison: 02/17/2018
COMPARISON: 02/17/2018

Addendum:
EXAM:
OVER-READ INTERPRETATION  CT CHEST

The following report is an over-read performed by radiologist Dr.
Kwun Hung Facunla [REDACTED] on 02/01/2021. This over-read
does not include interpretation of cardiac or coronary anatomy or
pathology. The coronary calcium score interpretation by the
cardiologist is attached.
CLINICAL DATA: Risk stratification
Coronary Calcium Score
TECHNIQUE: The patient was scanned on a Siemens Force scanner. Axial
non-contrast 3 mm slices were carried out through the heart. The
data set was analyzed on a dedicated work station and scored using
the Agatson method.

[Series 2: casc 3.0 bv41 2 bestdiast 71 % · axial · 0.45mm/px · z∈[-256,-192]mm · 4 of 35 slices shown, 5 images]
[im 7/35  vessel]
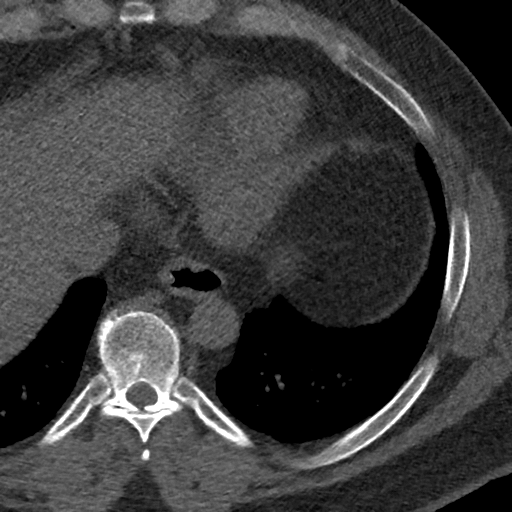
[im 7/35  lung]
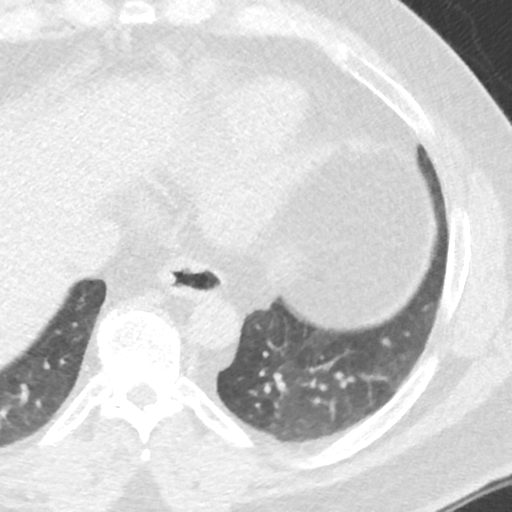
[im 14/35  vessel]
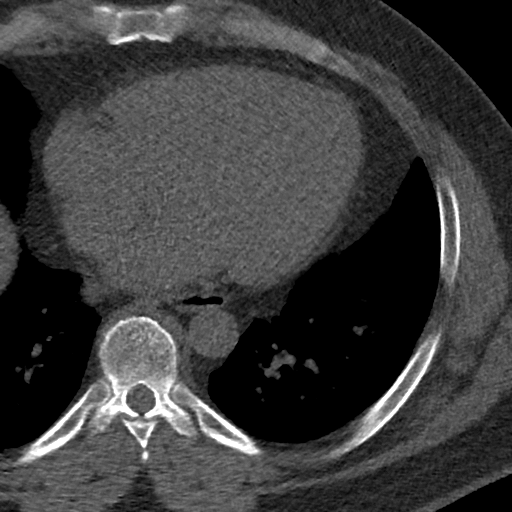
[im 21/35  vessel]
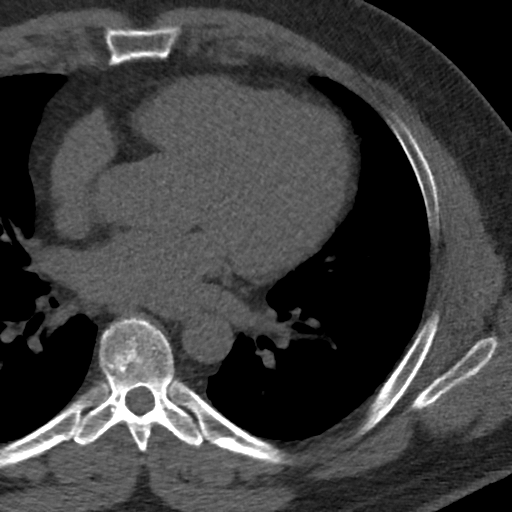
[im 28/35  vessel]
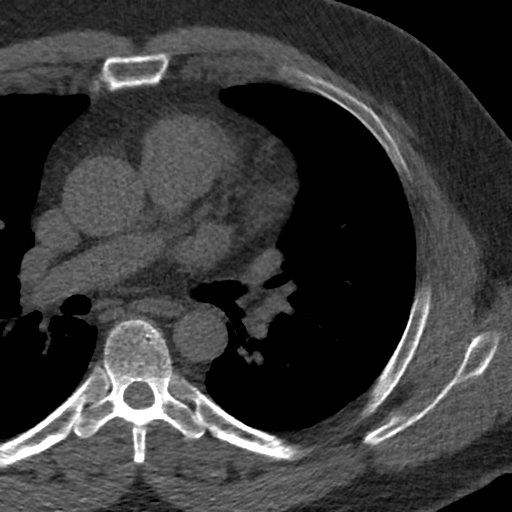

[Series 3: lung 71 % · axial · 0.70mm/px · z∈[-258,-190]mm · 5 of 35 slices shown]
[im 6/35  lung]
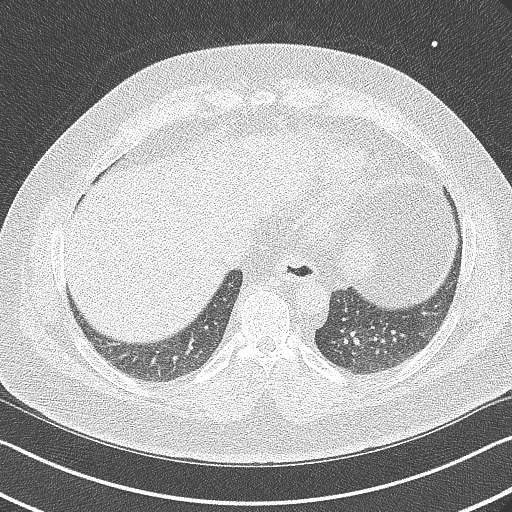
[im 12/35  lung]
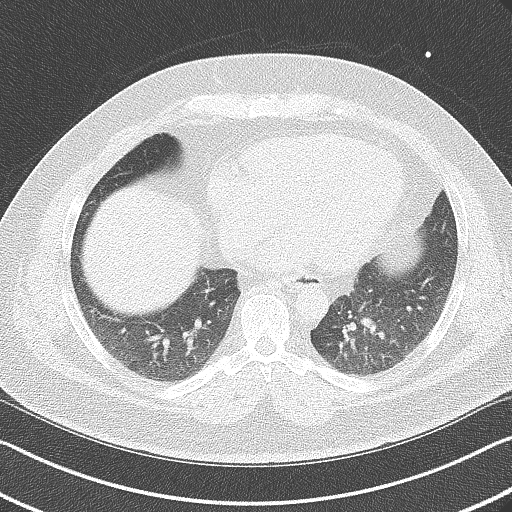
[im 18/35  lung]
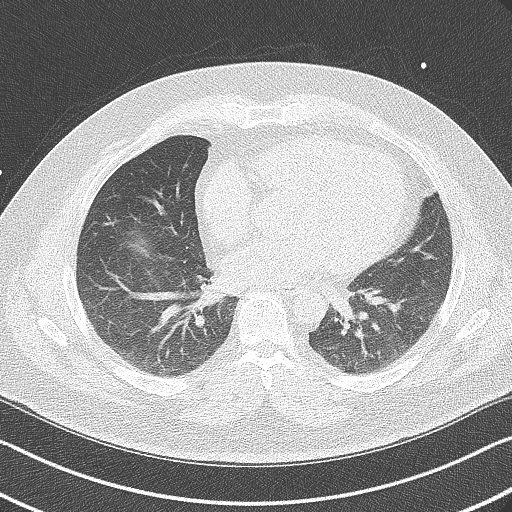
[im 23/35  lung]
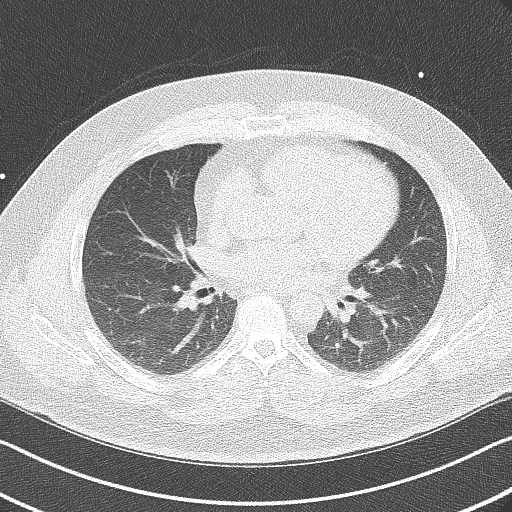
[im 29/35  lung]
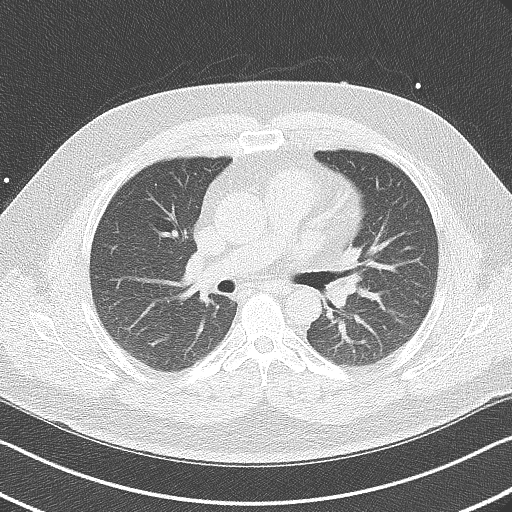

[Series 4: lung st 71 % · axial · 0.70mm/px · z∈[-258,-190]mm · 5 of 35 slices shown]
[im 6/35  lung]
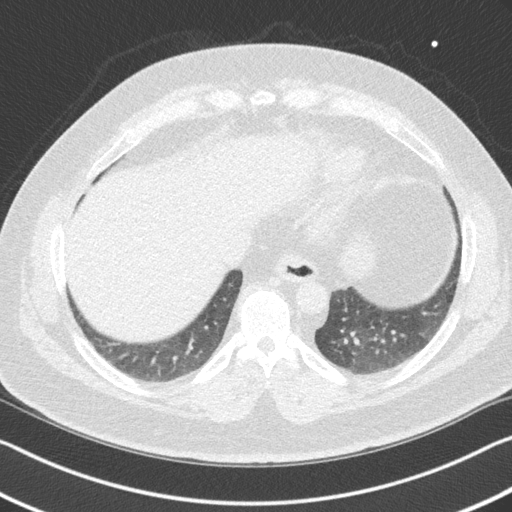
[im 12/35  lung]
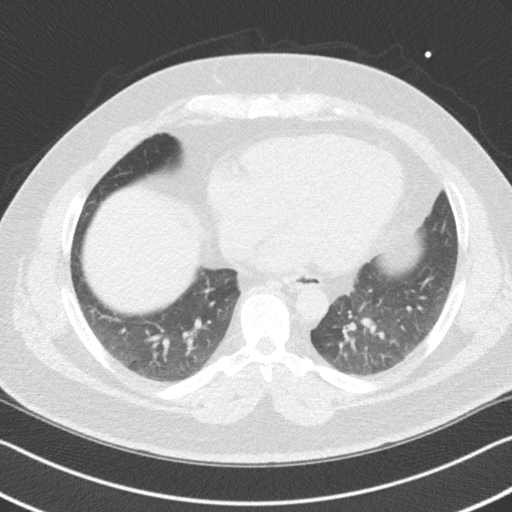
[im 18/35  lung]
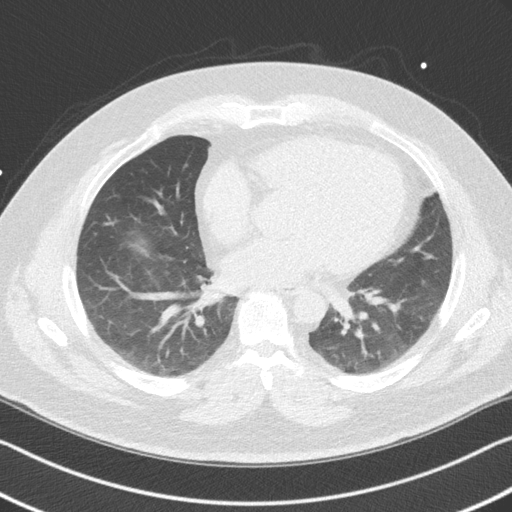
[im 23/35  lung]
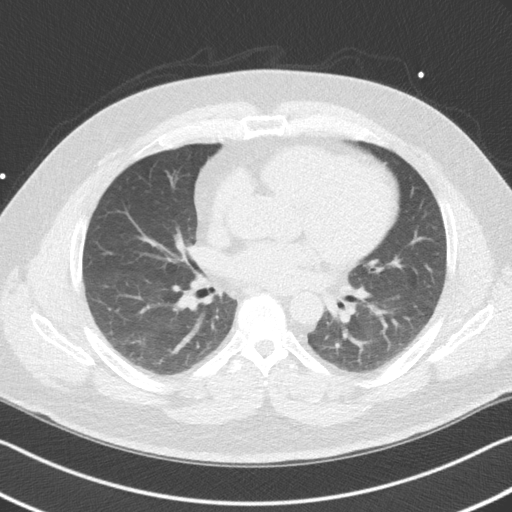
[im 29/35  lung]
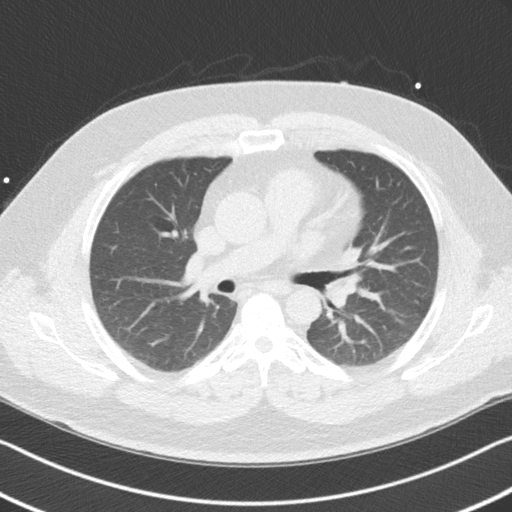

[14 of 20 positions shown; findings below may reference images not displayed]

FINDINGS: Vascular: Normal caliber of the visualized thoracic aorta without
atherosclerotic calcifications. No significant pericardial effusion.

Mediastinum/Nodes: Mediastinal structures are unremarkable.

Lungs/Pleura: Stable tiny peripheral density in the lingula on
sequence 2 image 12 is compatible with a benign finding. No large
pleural effusions. No significant airspace disease or consolidation
in the visualized lungs.

Upper Abdomen: Again noted is a hypodensity in left hepatic lobe
which is probably an incidental finding and measures roughly 1.0 cm.

Musculoskeletal: No acute bone abnormality.
IMPRESSION: No acute findings involving the extracardiac structures.
FINDINGS: Non-cardiac: See separate report from [REDACTED].

Ascending Aorta: Normal caliber

Pericardium: Normal

Coronary arteries: Normal origins
IMPRESSION: Coronary calcium score of 0. This is a low risk study.

*** End of Addendum ***
EXAM:
OVER-READ INTERPRETATION  CT CHEST

The following report is an over-read performed by radiologist Dr.
Kwun Hung Facunla [REDACTED] on 02/01/2021. This over-read
does not include interpretation of cardiac or coronary anatomy or
pathology. The coronary calcium score interpretation by the
cardiologist is attached.
FINDINGS: Vascular: Normal caliber of the visualized thoracic aorta without
atherosclerotic calcifications. No significant pericardial effusion.

Mediastinum/Nodes: Mediastinal structures are unremarkable.

Lungs/Pleura: Stable tiny peripheral density in the lingula on
sequence 2 image 12 is compatible with a benign finding. No large
pleural effusions. No significant airspace disease or consolidation
in the visualized lungs.

Upper Abdomen: Again noted is a hypodensity in left hepatic lobe
which is probably an incidental finding and measures roughly 1.0 cm.

Musculoskeletal: No acute bone abnormality.
IMPRESSION: No acute findings involving the extracardiac structures.

## 2021-07-09 ENCOUNTER — Other Ambulatory Visit: Payer: Federal, State, Local not specified - PPO

## 2021-07-09 ENCOUNTER — Ambulatory Visit: Payer: Federal, State, Local not specified - PPO | Admitting: Cardiovascular Disease

## 2021-07-26 NOTE — Progress Notes (Signed)
Cardiology Office Note:    Date:  08/02/2021   ID:  Tanner Harrison, DOB [birth year], MRN [id number]  PCP:  Karle Starch, MD  Cardiologist:  Dr. Jenkins Rouge     History of Present Illness:     Tanner Harrison is a 50 y.o. male  family history of premature CAD (father with MI late 69s, mom with heart valve replacement in her 75s), hypothyroidism, dyslipidemia. He was evaluated by me  in 2/16. Cardiac CT demonstrated calcium score 0. Updated calcium score 0 2/211/22 also 0  Returns for FU. Doing well. He tries to run 1-2 times a week.  He denies chest pain, dyspnea, syncope, orthopnea, PND, edema.  He snores.  His wife has not noticed any apnea.  He denies daytime hypersomnolence.    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   Discussed his testosterone replacement Hct 50.2      Past Medical History:  Diagnosis Date   Headache    hx of as teen   Heart murmur    hx of   Hemorrhoids    Hyperlipidemia    Hypertension    Hypothyroid    Low TSH level    Pneumonia    20 years ago    Past Surgical History:  Procedure Laterality Date   colonocsopy     2003 11/04/17   COLONOSCOPY WITH PROPOFOL N/A 11/04/2017   Procedure: COLONOSCOPY WITH PROPOFOL DIAGNOSTIC;  Surgeon: Leighton Ruff, MD;  Location: [facility];  Service: Endoscopy;  Laterality: N/A;   HERNIA REPAIR     x2 bil inguinal, and 2007 L inguinal repaired   Left hand cyst removed     MOUTH SURGERY     teeth extraction    Current Medications: Outpatient Medications Prior to Visit  Medication Sig Dispense Refill   albuterol (PROVENTIL HFA;VENTOLIN HFA) 108 (90 BASE) MCG/ACT inhaler Inhale 2 puffs into the lungs every 6 (six) hours as needed for wheezing or shortness of breath.     anastrozole (ARIMIDEX) 1 MG tablet Take 0.5 tablets by mouth once a week. ONLY ON  WEDNESDAYS     Ascorbic Acid (VITAMIN C) POWD Take 1,000 mg daily by mouth.     aspirin EC 81 MG tablet Take 81 mg by mouth daily.     atorvastatin (LIPITOR) 20 MG tablet Take 1 tablet (20 mg total) by mouth daily. 90 tablet 3   DHEA 50 MG TABS Take 50 mg daily by mouth.     ketotifen (ZADITOR) 0.025 % ophthalmic solution Place 1 drop daily as needed into both eyes (allergies).     levothyroxine (SYNTHROID, LEVOTHROID) 112 MCG tablet Take 112 mcg by mouth daily.  0   OVER THE COUNTER MEDICATION Take 1 tablet by mouth daily with lunch. Glucose Optimizer OTC supplement     Pregnenolone POWD pregnenolone (bulk) powder     testosterone cypionate (DEPOTESTOSTERONE CYPIONATE) 200 MG/ML injection Inject 0.5 mLs into the muscle every 14 (fourteen) days.   1   thyroid (ARMOUR) 15 MG tablet Take 15 mg See admin instructions by mouth. Take 15 mg daily by mouth on Tue, Thur, Sat, and Sun.  Take 30 mg once daily on Mon, Wed, and Fri     triamterene-hydrochlorothiazide (MAXZIDE-25) 37.5-25 MG tablet TAKE 1 TABLET BY MOUTH DAILY 90 tablet 3  UNABLE TO FIND Take 20 mg by mouth daily. Med Name: Pregnenolone     No facility-administered medications prior to visit.     Allergies:   Gluten meal   Social History   Socioeconomic History   Marital status: Married    Spouse name: Not on file   Number of children: Not on file   Years of education: Not on file   Highest education level: Not on file  Occupational History   Occupation: tax Printmaker: [employer]  Tobacco Use   Smoking status: Former    Types: Cigarettes    Quit date: 1998    Years since quitting: 24.6   Smokeless tobacco: Never  Vaping Use   Vaping Use: Never used  Substance and Sexual Activity   Alcohol use: No    Alcohol/week: 0.0 standard drinks   Drug use: No   Sexual activity: Yes  Other Topics Concern   Not on file  Social History Narrative   Tax Administrator with [employer]   Married   4 children -  3 living; Daughter married and lives in [place], [place]   Social Determinants of Health   Financial Resource Strain: Not on file  Food Insecurity: Not on file  Transportation Needs: Not on file  Physical Activity: Not on file  Stress: Not on file  Social Connections: Not on file     Family History:  The patient's family history includes Heart Problems (age of onset: 60) in his mother; Heart attack (age of onset: 53) in his father.   ROS:   Please see the history of present illness.    Review of Systems  Respiratory:  Positive for snoring.   All other systems reviewed and are negative.   Physical Exam:    VS:  BP 132/74   Pulse 80   Ht '5\' 6"'$  (1.676 m)   Wt 102.3 kg   SpO2 97%   BMI 36.41 kg/m    Affect appropriate Healthy:  appears stated age HEENT: normal Neck supple with no adenopathy JVP normal no bruits no thyromegaly Lungs clear with no wheezing and good diaphragmatic motion Heart:  S1/S2 no murmur, no rub, gallop or click PMI normal Abdomen: benighn, BS positve, no tenderness, no AAA no bruit.  No HSM or HJR Distal pulses intact with no bruits No edema Neuro non-focal Skin warm and dry Post left THR     Wt Readings from Last 3 Encounters:  08/02/21 102.3 kg  03/29/21 102.1 kg  04/02/20 94.6 kg      Studies/Labs Reviewed:     EKG:  Feb 28, 2019 SR rate 70 normal 08/02/2021 NSR rate 73 nonspecific ST changes PVC   Recent Labs: No results found for requested labs within last 8760 hours.  Labs 11/14/15 (from PCP): K 4.5, creatinine 0.93, ALT 26, TC 179, HDL 45, LDL 110, Hgb 16.2, TSH 1.617  Recent Lipid Panel No results found for: CHOL, TRIG, HDL, CHOLHDL, VLDL, LDLCALC, LDLDIRECT  Additional studies/ records that were reviewed today include:   Cardiac CT 3/16 IMPRESSION: Coronary calcium score of 0.  Cardiac Calcium Score 02/01/21 Score 0   ASSESSMENT:     Family History CAD/HTN  PLAN:     In order of problems listed above:  1. HTN -  Well  controlled.  Continue current medications and low sodium Dash type diet.    2. FHx of CAD - Ca score  02/01/21  still 0 .  No angina.  Continue ASA, statin.      3. HLD   Lipitor 20 mg labs today LFTls normal after ERCP   4. Thyroid: on replacement TSH normal   5. Ortho:  Post left hip replacement improved F/U Dr Rosanna Randy [facility]   6. GI:  Post gallbladder surgery and ERCP x 3   7. Testosterone:  discussed risks of clotting and Hct being too high f/u primary   Lipid/Liver/ Hct   F/U in a year   [employer]

## 2021-08-02 ENCOUNTER — Other Ambulatory Visit: Payer: Self-pay

## 2021-08-02 ENCOUNTER — Ambulatory Visit: Payer: Federal, State, Local not specified - PPO | Admitting: Cardiovascular Disease

## 2021-08-02 ENCOUNTER — Other Ambulatory Visit: Payer: Federal, State, Local not specified - PPO

## 2021-08-02 ENCOUNTER — Encounter: Payer: Self-pay | Admitting: Cardiovascular Disease

## 2021-08-02 VITALS — BP 132/74 | HR 80 | Ht 66.0 in | Wt 225.6 lb

## 2021-08-02 DIAGNOSIS — E785 Hyperlipidemia, unspecified: Secondary | ICD-10-CM

## 2021-08-02 DIAGNOSIS — Z8249 Family history of ischemic heart disease and other diseases of the circulatory system: Secondary | ICD-10-CM | POA: Diagnosis not present

## 2021-08-02 DIAGNOSIS — I10 Essential (primary) hypertension: Secondary | ICD-10-CM

## 2021-08-02 LAB — HEPATIC FUNCTION PANEL
ALT: 39 IU/L (ref 0–44)
AST: 27 IU/L (ref 0–40)
Albumin: 4.3 g/dL (ref 3.8–4.9)
Alkaline Phosphatase: 113 IU/L (ref 44–121)
Bilirubin Total: 0.9 mg/dL (ref 0.0–1.2)
Bilirubin, Direct: 0.22 mg/dL (ref 0.00–0.40)
Total Protein: 6.7 g/dL (ref 6.0–8.5)

## 2021-08-02 LAB — HEMOGLOBIN AND HEMATOCRIT, BLOOD
Hematocrit: 49.4 % (ref 37.5–51.0)
Hemoglobin: 16.3 g/dL (ref 13.0–17.7)

## 2021-08-02 LAB — LIPID PANEL
Chol/HDL Ratio: 4.1 ratio (ref 0.0–5.0)
Cholesterol, Total: 172 mg/dL (ref 100–199)
HDL: 42 mg/dL (ref >39)
LDL Chol Calc (NIH): 107 mg/dL — ABNORMAL HIGH (ref 0–99)
Triglycerides: 127 mg/dL (ref 0–149)
VLDL Cholesterol Cal: 23 mg/dL (ref 5–40)

## 2021-08-02 NOTE — Patient Instructions (Signed)
Medication Instructions:  Continue current medications  *If you need a refill on your cardiac medications before your next appointment, please call your pharmacy*   Lab Work: Hemoglobin, Hematocrit, Lipid, Liver  If you have labs (blood work) drawn today and your tests are completely normal, you will receive your results only by: MyChart Message (if you have MyChart) OR A paper copy in the mail If you have any lab test that is abnormal or we need to change your treatment, we will call you to review the results.   Testing/Procedures: none   Follow-Up: At [facility], you and your health needs are our priority.  As part of our continuing mission to provide you with exceptional heart care, we have created designated Provider Care Teams.  These Care Teams include your primary Cardiologist (physician) and Advanced Practice Providers (APPs -  Physician Assistants and Nurse Practitioners) who all work together to provide you with the care you need, when you need it.  We recommend signing up for the patient portal called "MyChart".  Sign up information is provided on this After Visit Summary.  MyChart is used to connect with patients for Virtual Visits (Telemedicine).  Patients are able to view lab/test results, encounter notes, upcoming appointments, etc.  Non-urgent messages can be sent to your provider as well.   To learn more about what you can do with MyChart, go to [email or web address].    Your next appointment:   12 month(s)  The format for your next appointment:   In Person  Provider:   Jenkins Rouge, MD

## 2021-11-28 DIAGNOSIS — L820 Inflamed seborrheic keratosis: Secondary | ICD-10-CM | POA: Diagnosis not present

## 2021-11-28 DIAGNOSIS — R208 Other disturbances of skin sensation: Secondary | ICD-10-CM | POA: Diagnosis not present

## 2021-11-28 DIAGNOSIS — L821 Other seborrheic keratosis: Secondary | ICD-10-CM | POA: Diagnosis not present

## 2021-11-28 DIAGNOSIS — L218 Other seborrheic dermatitis: Secondary | ICD-10-CM | POA: Diagnosis not present

## 2021-12-20 DIAGNOSIS — K429 Umbilical hernia without obstruction or gangrene: Secondary | ICD-10-CM | POA: Diagnosis not present

## 2021-12-20 DIAGNOSIS — Z6836 Body mass index (BMI) 36.0-36.9, adult: Secondary | ICD-10-CM | POA: Diagnosis not present

## 2021-12-22 ENCOUNTER — Other Ambulatory Visit: Payer: Self-pay | Admitting: Cardiovascular Disease

## 2022-01-20 ENCOUNTER — Other Ambulatory Visit: Payer: Self-pay | Admitting: Cardiovascular Disease

## 2022-01-20 DIAGNOSIS — I10 Essential (primary) hypertension: Secondary | ICD-10-CM

## 2022-01-31 DIAGNOSIS — K2100 Gastro-esophageal reflux disease with esophagitis, without bleeding: Secondary | ICD-10-CM | POA: Diagnosis not present

## 2022-01-31 DIAGNOSIS — K222 Esophageal obstruction: Secondary | ICD-10-CM | POA: Diagnosis not present

## 2022-01-31 DIAGNOSIS — K640 First degree hemorrhoids: Secondary | ICD-10-CM | POA: Diagnosis not present

## 2022-01-31 DIAGNOSIS — K6389 Other specified diseases of intestine: Secondary | ICD-10-CM | POA: Diagnosis not present

## 2022-01-31 DIAGNOSIS — K5730 Diverticulosis of large intestine without perforation or abscess without bleeding: Secondary | ICD-10-CM | POA: Diagnosis not present

## 2022-01-31 DIAGNOSIS — D125 Benign neoplasm of sigmoid colon: Secondary | ICD-10-CM | POA: Diagnosis not present

## 2022-01-31 DIAGNOSIS — D122 Benign neoplasm of ascending colon: Secondary | ICD-10-CM | POA: Diagnosis not present

## 2022-01-31 DIAGNOSIS — Z1211 Encounter for screening for malignant neoplasm of colon: Secondary | ICD-10-CM | POA: Diagnosis not present

## 2022-01-31 DIAGNOSIS — K3189 Other diseases of stomach and duodenum: Secondary | ICD-10-CM | POA: Diagnosis not present

## 2022-01-31 DIAGNOSIS — K635 Polyp of colon: Secondary | ICD-10-CM | POA: Diagnosis not present

## 2022-01-31 DIAGNOSIS — Z86010 Personal history of colonic polyps: Secondary | ICD-10-CM | POA: Diagnosis not present

## 2022-02-03 DIAGNOSIS — Z01818 Encounter for other preprocedural examination: Secondary | ICD-10-CM | POA: Diagnosis not present

## 2022-02-11 DIAGNOSIS — R5381 Other malaise: Secondary | ICD-10-CM | POA: Diagnosis not present

## 2022-02-11 DIAGNOSIS — E291 Testicular hypofunction: Secondary | ICD-10-CM | POA: Diagnosis not present

## 2022-02-11 DIAGNOSIS — E039 Hypothyroidism, unspecified: Secondary | ICD-10-CM | POA: Diagnosis not present

## 2022-02-18 DIAGNOSIS — Z79899 Other long term (current) drug therapy: Secondary | ICD-10-CM | POA: Diagnosis not present

## 2022-02-18 DIAGNOSIS — Z7982 Long term (current) use of aspirin: Secondary | ICD-10-CM | POA: Diagnosis not present

## 2022-02-18 DIAGNOSIS — I10 Essential (primary) hypertension: Secondary | ICD-10-CM | POA: Diagnosis not present

## 2022-02-18 DIAGNOSIS — Z79811 Long term (current) use of aromatase inhibitors: Secondary | ICD-10-CM | POA: Diagnosis not present

## 2022-02-18 DIAGNOSIS — K2100 Gastro-esophageal reflux disease with esophagitis, without bleeding: Secondary | ICD-10-CM | POA: Diagnosis not present

## 2022-02-18 DIAGNOSIS — K8020 Calculus of gallbladder without cholecystitis without obstruction: Secondary | ICD-10-CM | POA: Diagnosis not present

## 2022-02-18 DIAGNOSIS — K430 Incisional hernia with obstruction, without gangrene: Secondary | ICD-10-CM | POA: Diagnosis not present

## 2022-02-18 DIAGNOSIS — F419 Anxiety disorder, unspecified: Secondary | ICD-10-CM | POA: Diagnosis not present

## 2022-02-18 DIAGNOSIS — E785 Hyperlipidemia, unspecified: Secondary | ICD-10-CM | POA: Diagnosis not present

## 2022-02-18 DIAGNOSIS — E669 Obesity, unspecified: Secondary | ICD-10-CM | POA: Diagnosis not present

## 2022-02-18 DIAGNOSIS — D125 Benign neoplasm of sigmoid colon: Secondary | ICD-10-CM | POA: Diagnosis not present

## 2022-02-18 DIAGNOSIS — Z6838 Body mass index (BMI) 38.0-38.9, adult: Secondary | ICD-10-CM | POA: Diagnosis not present

## 2022-02-18 DIAGNOSIS — K421 Umbilical hernia with gangrene: Secondary | ICD-10-CM | POA: Diagnosis not present

## 2022-02-18 DIAGNOSIS — K3189 Other diseases of stomach and duodenum: Secondary | ICD-10-CM | POA: Diagnosis not present

## 2022-02-18 DIAGNOSIS — M7989 Other specified soft tissue disorders: Secondary | ICD-10-CM | POA: Diagnosis not present

## 2022-02-18 DIAGNOSIS — E7800 Pure hypercholesterolemia, unspecified: Secondary | ICD-10-CM | POA: Diagnosis not present

## 2022-02-27 DIAGNOSIS — E291 Testicular hypofunction: Secondary | ICD-10-CM | POA: Diagnosis not present

## 2022-02-27 DIAGNOSIS — R5383 Other fatigue: Secondary | ICD-10-CM | POA: Diagnosis not present

## 2022-02-27 DIAGNOSIS — J309 Allergic rhinitis, unspecified: Secondary | ICD-10-CM | POA: Diagnosis not present

## 2022-03-05 DIAGNOSIS — K429 Umbilical hernia without obstruction or gangrene: Secondary | ICD-10-CM | POA: Diagnosis not present

## 2022-03-13 DIAGNOSIS — F4310 Post-traumatic stress disorder, unspecified: Secondary | ICD-10-CM | POA: Diagnosis not present

## 2022-03-19 DIAGNOSIS — F4310 Post-traumatic stress disorder, unspecified: Secondary | ICD-10-CM | POA: Diagnosis not present

## 2022-03-20 DIAGNOSIS — F4310 Post-traumatic stress disorder, unspecified: Secondary | ICD-10-CM | POA: Diagnosis not present

## 2022-03-25 DIAGNOSIS — F4310 Post-traumatic stress disorder, unspecified: Secondary | ICD-10-CM | POA: Diagnosis not present

## 2022-03-26 DIAGNOSIS — F4310 Post-traumatic stress disorder, unspecified: Secondary | ICD-10-CM | POA: Diagnosis not present

## 2022-03-27 DIAGNOSIS — E291 Testicular hypofunction: Secondary | ICD-10-CM | POA: Diagnosis not present

## 2022-03-27 DIAGNOSIS — R5383 Other fatigue: Secondary | ICD-10-CM | POA: Diagnosis not present

## 2022-03-27 DIAGNOSIS — E063 Autoimmune thyroiditis: Secondary | ICD-10-CM | POA: Diagnosis not present

## 2022-03-27 DIAGNOSIS — E039 Hypothyroidism, unspecified: Secondary | ICD-10-CM | POA: Diagnosis not present

## 2022-03-27 DIAGNOSIS — L659 Nonscarring hair loss, unspecified: Secondary | ICD-10-CM | POA: Diagnosis not present

## 2022-04-03 DIAGNOSIS — F4310 Post-traumatic stress disorder, unspecified: Secondary | ICD-10-CM | POA: Diagnosis not present

## 2022-04-09 DIAGNOSIS — F4310 Post-traumatic stress disorder, unspecified: Secondary | ICD-10-CM | POA: Diagnosis not present

## 2022-04-10 DIAGNOSIS — F4310 Post-traumatic stress disorder, unspecified: Secondary | ICD-10-CM | POA: Diagnosis not present

## 2022-04-17 DIAGNOSIS — F4310 Post-traumatic stress disorder, unspecified: Secondary | ICD-10-CM | POA: Diagnosis not present

## 2022-04-23 DIAGNOSIS — F4310 Post-traumatic stress disorder, unspecified: Secondary | ICD-10-CM | POA: Diagnosis not present

## 2022-04-24 DIAGNOSIS — F4310 Post-traumatic stress disorder, unspecified: Secondary | ICD-10-CM | POA: Diagnosis not present

## 2022-04-29 DIAGNOSIS — F4310 Post-traumatic stress disorder, unspecified: Secondary | ICD-10-CM | POA: Diagnosis not present

## 2022-05-01 DIAGNOSIS — F4310 Post-traumatic stress disorder, unspecified: Secondary | ICD-10-CM | POA: Diagnosis not present

## 2022-05-06 DIAGNOSIS — F4310 Post-traumatic stress disorder, unspecified: Secondary | ICD-10-CM | POA: Diagnosis not present

## 2022-05-13 DIAGNOSIS — K080 Exfoliation of teeth due to systemic causes: Secondary | ICD-10-CM | POA: Diagnosis not present

## 2022-05-14 DIAGNOSIS — F4310 Post-traumatic stress disorder, unspecified: Secondary | ICD-10-CM | POA: Diagnosis not present

## 2022-05-20 DIAGNOSIS — F4310 Post-traumatic stress disorder, unspecified: Secondary | ICD-10-CM | POA: Diagnosis not present

## 2022-05-22 DIAGNOSIS — F4310 Post-traumatic stress disorder, unspecified: Secondary | ICD-10-CM | POA: Diagnosis not present

## 2022-05-29 DIAGNOSIS — F4310 Post-traumatic stress disorder, unspecified: Secondary | ICD-10-CM | POA: Diagnosis not present

## 2022-05-30 DIAGNOSIS — F4310 Post-traumatic stress disorder, unspecified: Secondary | ICD-10-CM | POA: Diagnosis not present

## 2022-06-03 DIAGNOSIS — F4310 Post-traumatic stress disorder, unspecified: Secondary | ICD-10-CM | POA: Diagnosis not present

## 2022-06-05 DIAGNOSIS — F4310 Post-traumatic stress disorder, unspecified: Secondary | ICD-10-CM | POA: Diagnosis not present

## 2022-06-12 DIAGNOSIS — F4310 Post-traumatic stress disorder, unspecified: Secondary | ICD-10-CM | POA: Diagnosis not present

## 2022-06-17 DIAGNOSIS — F4310 Post-traumatic stress disorder, unspecified: Secondary | ICD-10-CM | POA: Diagnosis not present

## 2022-06-19 DIAGNOSIS — F4310 Post-traumatic stress disorder, unspecified: Secondary | ICD-10-CM | POA: Diagnosis not present

## 2022-06-20 DIAGNOSIS — Z6838 Body mass index (BMI) 38.0-38.9, adult: Secondary | ICD-10-CM | POA: Diagnosis not present

## 2022-06-20 DIAGNOSIS — D649 Anemia, unspecified: Secondary | ICD-10-CM | POA: Diagnosis not present

## 2022-06-20 DIAGNOSIS — D509 Iron deficiency anemia, unspecified: Secondary | ICD-10-CM | POA: Diagnosis not present

## 2022-06-20 DIAGNOSIS — D500 Iron deficiency anemia secondary to blood loss (chronic): Secondary | ICD-10-CM | POA: Diagnosis not present

## 2022-06-26 DIAGNOSIS — M5416 Radiculopathy, lumbar region: Secondary | ICD-10-CM | POA: Diagnosis not present

## 2022-06-26 DIAGNOSIS — Z96642 Presence of left artificial hip joint: Secondary | ICD-10-CM | POA: Diagnosis not present

## 2022-07-01 DIAGNOSIS — M5416 Radiculopathy, lumbar region: Secondary | ICD-10-CM | POA: Diagnosis not present

## 2022-07-01 DIAGNOSIS — Z96642 Presence of left artificial hip joint: Secondary | ICD-10-CM | POA: Diagnosis not present

## 2022-07-03 DIAGNOSIS — D235 Other benign neoplasm of skin of trunk: Secondary | ICD-10-CM | POA: Diagnosis not present

## 2022-07-03 DIAGNOSIS — L820 Inflamed seborrheic keratosis: Secondary | ICD-10-CM | POA: Diagnosis not present

## 2022-07-03 DIAGNOSIS — L821 Other seborrheic keratosis: Secondary | ICD-10-CM | POA: Diagnosis not present

## 2022-07-03 DIAGNOSIS — L918 Other hypertrophic disorders of the skin: Secondary | ICD-10-CM | POA: Diagnosis not present

## 2022-07-03 DIAGNOSIS — L538 Other specified erythematous conditions: Secondary | ICD-10-CM | POA: Diagnosis not present

## 2022-07-03 DIAGNOSIS — L400 Psoriasis vulgaris: Secondary | ICD-10-CM | POA: Diagnosis not present

## 2022-07-08 DIAGNOSIS — F4310 Post-traumatic stress disorder, unspecified: Secondary | ICD-10-CM | POA: Diagnosis not present

## 2022-07-10 DIAGNOSIS — F4310 Post-traumatic stress disorder, unspecified: Secondary | ICD-10-CM | POA: Diagnosis not present

## 2022-07-11 DIAGNOSIS — Z48817 Encounter for surgical aftercare following surgery on the skin and subcutaneous tissue: Secondary | ICD-10-CM | POA: Diagnosis not present

## 2022-07-15 DIAGNOSIS — F4310 Post-traumatic stress disorder, unspecified: Secondary | ICD-10-CM | POA: Diagnosis not present

## 2022-07-17 ENCOUNTER — Other Ambulatory Visit: Payer: Self-pay | Admitting: Cardiovascular Disease

## 2022-07-17 DIAGNOSIS — I10 Essential (primary) hypertension: Secondary | ICD-10-CM

## 2022-07-17 DIAGNOSIS — M1712 Unilateral primary osteoarthritis, left knee: Secondary | ICD-10-CM | POA: Diagnosis not present

## 2022-07-24 DIAGNOSIS — F4310 Post-traumatic stress disorder, unspecified: Secondary | ICD-10-CM | POA: Diagnosis not present

## 2022-07-29 DIAGNOSIS — F4310 Post-traumatic stress disorder, unspecified: Secondary | ICD-10-CM | POA: Diagnosis not present

## 2022-07-30 DIAGNOSIS — E0590 Thyrotoxicosis, unspecified without thyrotoxic crisis or storm: Secondary | ICD-10-CM | POA: Diagnosis not present

## 2022-07-30 DIAGNOSIS — R5383 Other fatigue: Secondary | ICD-10-CM | POA: Diagnosis not present

## 2022-07-30 DIAGNOSIS — E039 Hypothyroidism, unspecified: Secondary | ICD-10-CM | POA: Diagnosis not present

## 2022-07-30 DIAGNOSIS — E063 Autoimmune thyroiditis: Secondary | ICD-10-CM | POA: Diagnosis not present

## 2022-07-31 DIAGNOSIS — F4310 Post-traumatic stress disorder, unspecified: Secondary | ICD-10-CM | POA: Diagnosis not present

## 2022-08-05 DIAGNOSIS — E039 Hypothyroidism, unspecified: Secondary | ICD-10-CM | POA: Diagnosis not present

## 2022-08-05 DIAGNOSIS — L659 Nonscarring hair loss, unspecified: Secondary | ICD-10-CM | POA: Diagnosis not present

## 2022-08-05 DIAGNOSIS — E063 Autoimmune thyroiditis: Secondary | ICD-10-CM | POA: Diagnosis not present

## 2022-08-05 DIAGNOSIS — F4310 Post-traumatic stress disorder, unspecified: Secondary | ICD-10-CM | POA: Diagnosis not present

## 2022-08-05 DIAGNOSIS — R5383 Other fatigue: Secondary | ICD-10-CM | POA: Diagnosis not present

## 2022-08-06 NOTE — Progress Notes (Signed)
Cardiology Office Note:    Date:  08/13/2022   ID:  Tanner Harrison, DOB 02/23/1971, MRN [id number]  PCP:  Karle Starch, MD  Cardiologist:  Dr. Jenkins Rouge     History of Present Illness:     Tanner Harrison is a 51 y.o. male  family history of premature CAD (father with MI late 87s, mom with heart valve replacement in her 62s), hypothyroidism, dyslipidemia. He was evaluated by me  in 2/16. Cardiac CT demonstrated calcium score 0. Updated calcium score 0 2/211/22 also 0  Returns for FU. Doing well. He tries to run 1-2 times a week.  He denies chest pain, dyspnea, syncope, orthopnea, PND, edema.  He snores.  His wife has not noticed any apnea.  He denies daytime hypersomnolence.    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   Discussed his testosterone replacement Hct 50.2   He is seeing a therapist regarding grief from stillborn child with Trisomy 23     Past Medical History:  Diagnosis Date   Headache    hx of as teen   Heart murmur    hx of   Hemorrhoids    Hyperlipidemia    Hypertension    Hypothyroid    Low TSH level    Pneumonia    20 years ago    Past Surgical History:  Procedure Laterality Date   colonocsopy     2003 11/04/17   COLONOSCOPY WITH PROPOFOL N/A 11/04/2017   Procedure: COLONOSCOPY WITH PROPOFOL DIAGNOSTIC;  Surgeon: Leighton Ruff, MD;  Location: [facility];  Service: Endoscopy;  Laterality: N/A;   HERNIA REPAIR     x2 bil inguinal, and 2007 L inguinal repaired   Left hand cyst removed     MOUTH SURGERY     teeth extraction    Current Medications: Outpatient Medications Prior to Visit  Medication Sig Dispense Refill   albuterol (PROVENTIL HFA;VENTOLIN HFA) 108 (90 BASE) MCG/ACT inhaler Inhale 2 puffs into the lungs every 6 (six) hours as needed for wheezing or shortness of breath.      amoxicillin (AMOXIL) 500 MG tablet amoxicillin 500 mg tablet  TAKE 4 TABLETS BY MOUTH 1 HOUR BEFORE DENTIST OR OTHER PROCEDURES     anastrozole (ARIMIDEX) 1 MG tablet Take 0.5 tablets by mouth once a week. ONLY ON WEDNESDAYS     Ascorbic Acid (VITAMIN C) POWD Take 1,000 mg daily by mouth.     aspirin EC 81 MG tablet Take 81 mg by mouth daily.     atorvastatin (LIPITOR) 20 MG tablet TAKE 1 TABLET(20 MG) BY MOUTH DAILY 90 tablet 1   DHEA 50 MG TABS Take 50 mg daily by mouth.     ketotifen (ZADITOR) 0.025 % ophthalmic solution Place 1 drop daily as needed into both eyes (allergies).     levothyroxine (SYNTHROID, LEVOTHROID) 112 MCG tablet Take 112 mcg by mouth daily.  0   OVER THE COUNTER MEDICATION Take 1 tablet by mouth daily with lunch. Glucose Optimizer OTC supplement     Pregnenolone POWD pregnenolone (bulk) powder     testosterone cypionate (DEPOTESTOTERONE CYPIONATE) 100 MG/ML injection testosterone cypionate 100 mg/mL intramuscular oil  INJECT 1 ML INTO THE MUSCLE EVERY 12 DAYS     thyroid (ARMOUR) 15 MG tablet Take 15 mg See admin instructions by  mouth. Take 15 mg daily by mouth on Tue, Thur, Sat, and Sun.  Take 30 mg once daily on Mon, Wed, and Fri     triamterene-hydrochlorothiazide (MAXZIDE-25) 37.5-25 MG tablet TAKE 1 TABLET BY MOUTH DAILY 90 tablet 0   UNABLE TO FIND Take 20 mg by mouth daily. Med Name: Pregnenolone     No facility-administered medications prior to visit.     Allergies:   Gluten meal   Social History   Socioeconomic History   Marital status: Married    Spouse name: Not on file   Number of children: Not on file   Years of education: Not on file   Highest education level: Not on file  Occupational History   Occupation: tax Printmaker: [employer]  Tobacco Use   Smoking status: Former    Types: Cigarettes    Quit date: 1998    Years since quitting: 25.6   Smokeless tobacco: Never  Vaping Use   Vaping Use: Never used  Substance and  Sexual Activity   Alcohol use: No    Alcohol/week: 0.0 standard drinks of alcohol   Drug use: No   Sexual activity: Yes  Other Topics Concern   Not on file  Social History Narrative   Tax Administrator with [employer]   Married   4 children - 3 living; Daughter married and lives in [place], [place]   Social Determinants of Health   Financial Resource Strain: Not on file  Food Insecurity: Not on file  Transportation Needs: Not on file  Physical Activity: Not on file  Stress: Not on file  Social Connections: Not on file     Family History:  The patient's family history includes Heart Problems (age of onset: 38) in his mother; Heart attack (age of onset: 64) in his father.   ROS:   Please see the history of present illness.    Review of Systems  Respiratory:  Positive for snoring.    All other systems reviewed and are negative.   Physical Exam:    VS:  There were no vitals taken for this visit.   Affect appropriate Healthy:  appears stated age 77: normal Neck supple with no adenopathy JVP normal no bruits no thyromegaly Lungs clear with no wheezing and good diaphragmatic motion Heart:  S1/S2 no murmur, no rub, gallop or click PMI normal Abdomen: benighn, BS positve, no tenderness, no AAA no bruit.  No HSM or HJR Distal pulses intact with no bruits No edema Neuro non-focal Skin warm and dry Post left THR     Wt Readings from Last 3 Encounters:  08/02/21 225 lb 9.6 oz (102.3 kg)  03/29/21 225 lb (102.1 kg)  04/02/20 208 lb 9.6 oz (94.6 kg)      Studies/Labs Reviewed:     EKG:  03/22/2019 SR rate 70 normal 08/13/2022 NSR rate 73 nonspecific ST changes PVC   Recent Labs: No results found for requested labs within last 365 days.  Labs 11/14/15 (from PCP): K 4.5, creatinine 0.93, ALT 26, TC 179, HDL 45, LDL 110, Hgb 16.2, TSH 1.617  Recent Lipid Panel    Component Value Date/Time   CHOL 172 08/02/2021 0828   TRIG 127 08/02/2021 0828   HDL 42 08/02/2021  0828   CHOLHDL 4.1 08/02/2021 0828   LDLCALC 107 (H) 08/02/2021 0828    Additional studies/ records that were reviewed today include:   Cardiac CT 3/16 IMPRESSION: Coronary calcium score of 0.  Cardiac Calcium Score 02/01/21 Score 0   ASSESSMENT:     Family History CAD/HTN  PLAN:     In order of problems listed above:  1. HTN -  Well controlled.  Continue current medications and low sodium Dash type diet.    2. FHx of CAD - Ca score  02/01/21  still 0 .  No angina.  Continue ASA, statin.      3. HLD   Lipitor 20 mg labs today LFTls normal after ERCP   4. Thyroid: on replacement TSH normal   5. Ortho:  Post left hip replacement improved F/U Dr Rosanna Randy [facility]   6. GI:  Post gallbladder surgery and ERCP x 3   7. Testosterone:  discussed risks of clotting and Hct being too high f/u primary Latest done 06/20/22 down to 44.6  on 06/20/22   Lipid/liver   F/U in a year   Jenkins Rouge

## 2022-08-07 DIAGNOSIS — F4310 Post-traumatic stress disorder, unspecified: Secondary | ICD-10-CM | POA: Diagnosis not present

## 2022-08-08 DIAGNOSIS — R7309 Other abnormal glucose: Secondary | ICD-10-CM | POA: Diagnosis not present

## 2022-08-08 DIAGNOSIS — E291 Testicular hypofunction: Secondary | ICD-10-CM | POA: Diagnosis not present

## 2022-08-08 DIAGNOSIS — E782 Mixed hyperlipidemia: Secondary | ICD-10-CM | POA: Diagnosis not present

## 2022-08-08 DIAGNOSIS — I10 Essential (primary) hypertension: Secondary | ICD-10-CM | POA: Diagnosis not present

## 2022-08-08 DIAGNOSIS — E039 Hypothyroidism, unspecified: Secondary | ICD-10-CM | POA: Diagnosis not present

## 2022-08-12 DIAGNOSIS — F4310 Post-traumatic stress disorder, unspecified: Secondary | ICD-10-CM | POA: Diagnosis not present

## 2022-08-13 ENCOUNTER — Ambulatory Visit: Payer: Federal, State, Local not specified - PPO | Admitting: Cardiovascular Disease

## 2022-08-13 VITALS — BP 140/78 | HR 75 | Ht 66.0 in | Wt 236.0 lb

## 2022-08-13 DIAGNOSIS — E785 Hyperlipidemia, unspecified: Secondary | ICD-10-CM

## 2022-08-13 DIAGNOSIS — I10 Essential (primary) hypertension: Secondary | ICD-10-CM

## 2022-08-13 DIAGNOSIS — Z8249 Family history of ischemic heart disease and other diseases of the circulatory system: Secondary | ICD-10-CM

## 2022-08-13 LAB — LIPID PANEL
Chol/HDL Ratio: 3.7 ratio (ref 0.0–5.0)
Cholesterol, Total: 165 mg/dL (ref 100–199)
HDL: 45 mg/dL (ref >39)
LDL Chol Calc (NIH): 96 mg/dL (ref 0–99)
Triglycerides: 135 mg/dL (ref 0–149)
VLDL Cholesterol Cal: 24 mg/dL (ref 5–40)

## 2022-08-13 LAB — HEPATIC FUNCTION PANEL
ALT: 30 IU/L (ref 0–44)
AST: 27 IU/L (ref 0–40)
Albumin: 4.6 g/dL (ref 3.8–4.9)
Alkaline Phosphatase: 97 IU/L (ref 44–121)
Bilirubin Total: 1.3 mg/dL — ABNORMAL HIGH (ref 0.0–1.2)
Bilirubin, Direct: 0.28 mg/dL (ref 0.00–0.40)
Total Protein: 7.2 g/dL (ref 6.0–8.5)

## 2022-08-13 NOTE — Patient Instructions (Signed)
Medication Instructions:  Your physician recommends that you continue on your current medications as directed. Please refer to the Current Medication list given to you today.  *If you need a refill on your cardiac medications before your next appointment, please call your pharmacy*  Lab Work: Your physician recommends that you have lab work- fasting lipid and liver panel. If you have labs (blood work) drawn today and your tests are completely normal, you will receive your results only by: [place] (if you have MyChart) OR A paper copy in the mail If you have any lab test that is abnormal or we need to change your treatment, we will call you to review the results.  Testing/Procedures: None ordered today.  Follow-Up: At [facility], you and your health needs are our priority.  As part of our continuing mission to provide you with exceptional heart care, we have created designated Provider Care Teams.  These Care Teams include your primary Cardiologist (physician) and Advanced Practice Providers (APPs -  Physician Assistants and Nurse Practitioners) who all work together to provide you with the care you need, when you need it.  We recommend signing up for the patient portal called "MyChart".  Sign up information is provided on this After Visit Summary.  MyChart is used to connect with patients for Virtual Visits (Telemedicine).  Patients are able to view lab/test results, encounter notes, upcoming appointments, etc.  Non-urgent messages can be sent to your provider as well.   To learn more about what you can do with MyChart, go to [email or web address].    Your next appointment:   1 year(s)  The format for your next appointment:   In Person  Provider:   Jenkins Rouge, MD {    Important Information About Sugar

## 2022-08-14 DIAGNOSIS — F4310 Post-traumatic stress disorder, unspecified: Secondary | ICD-10-CM | POA: Diagnosis not present

## 2022-08-14 NOTE — Addendum Note (Signed)
Addended by: Aris Georgia, Joey Hudock L on: 08/14/2022 08:07 AM   Modules accepted: Orders

## 2022-08-19 DIAGNOSIS — F4310 Post-traumatic stress disorder, unspecified: Secondary | ICD-10-CM | POA: Diagnosis not present

## 2022-08-26 DIAGNOSIS — F4310 Post-traumatic stress disorder, unspecified: Secondary | ICD-10-CM | POA: Diagnosis not present

## 2022-08-28 DIAGNOSIS — F4310 Post-traumatic stress disorder, unspecified: Secondary | ICD-10-CM | POA: Diagnosis not present

## 2022-09-02 DIAGNOSIS — F4310 Post-traumatic stress disorder, unspecified: Secondary | ICD-10-CM | POA: Diagnosis not present

## 2022-09-04 DIAGNOSIS — F4310 Post-traumatic stress disorder, unspecified: Secondary | ICD-10-CM | POA: Diagnosis not present

## 2022-09-09 DIAGNOSIS — F4310 Post-traumatic stress disorder, unspecified: Secondary | ICD-10-CM | POA: Diagnosis not present

## 2022-09-11 DIAGNOSIS — F4310 Post-traumatic stress disorder, unspecified: Secondary | ICD-10-CM | POA: Diagnosis not present

## 2022-09-18 DIAGNOSIS — F4310 Post-traumatic stress disorder, unspecified: Secondary | ICD-10-CM | POA: Diagnosis not present

## 2022-09-25 DIAGNOSIS — F4310 Post-traumatic stress disorder, unspecified: Secondary | ICD-10-CM | POA: Diagnosis not present

## 2022-09-27 ENCOUNTER — Other Ambulatory Visit: Payer: Self-pay | Admitting: Cardiovascular Disease

## 2022-09-29 ENCOUNTER — Encounter: Payer: Self-pay | Admitting: Cardiovascular Disease

## 2022-09-29 MED ORDER — ATORVASTATIN CALCIUM 20 MG PO TABS: ORAL_TABLET | ORAL | 3 refills | Status: DC

## 2022-09-29 NOTE — Telephone Encounter (Signed)
Outpatient Medication Detail   Disp Refills Start End   atorvastatin (LIPITOR) 20 MG tablet 90 tablet 3 09/29/2022    Sig: TAKE 1 TABLET(20 MG) BY MOUTH DAILY   Sent to pharmacy as: atorvastatin (LIPITOR) 20 MG tablet   Notes to Pharmacy: ZERO refills remain on this prescription. Your patient is requesting advance approval of refills for this medication to [place]   E-Prescribing Status: Receipt confirmed by pharmacy (09/29/2022  9:53 AM EDT)    Pharmacy  [id number]- [place], [place] - [id number]

## 2022-10-02 DIAGNOSIS — F4310 Post-traumatic stress disorder, unspecified: Secondary | ICD-10-CM | POA: Diagnosis not present

## 2022-10-06 ENCOUNTER — Ambulatory Visit: Payer: Federal, State, Local not specified - PPO | Admitting: Cardiovascular Disease

## 2022-10-09 DIAGNOSIS — F4310 Post-traumatic stress disorder, unspecified: Secondary | ICD-10-CM | POA: Diagnosis not present

## 2022-10-16 ENCOUNTER — Other Ambulatory Visit: Payer: Self-pay | Admitting: Cardiovascular Disease

## 2022-10-16 DIAGNOSIS — I10 Essential (primary) hypertension: Secondary | ICD-10-CM

## 2022-10-20 DIAGNOSIS — J309 Allergic rhinitis, unspecified: Secondary | ICD-10-CM | POA: Diagnosis not present

## 2022-10-20 DIAGNOSIS — E291 Testicular hypofunction: Secondary | ICD-10-CM | POA: Diagnosis not present

## 2022-10-20 DIAGNOSIS — F4310 Post-traumatic stress disorder, unspecified: Secondary | ICD-10-CM | POA: Diagnosis not present

## 2022-10-20 DIAGNOSIS — R5383 Other fatigue: Secondary | ICD-10-CM | POA: Diagnosis not present

## 2022-10-30 DIAGNOSIS — F4310 Post-traumatic stress disorder, unspecified: Secondary | ICD-10-CM | POA: Diagnosis not present

## 2022-11-20 DIAGNOSIS — F4310 Post-traumatic stress disorder, unspecified: Secondary | ICD-10-CM | POA: Diagnosis not present

## 2022-11-21 ENCOUNTER — Encounter: Payer: Self-pay | Admitting: Cardiovascular Disease

## 2022-11-21 DIAGNOSIS — I10 Essential (primary) hypertension: Secondary | ICD-10-CM

## 2022-11-21 DIAGNOSIS — N5201 Erectile dysfunction due to arterial insufficiency: Secondary | ICD-10-CM | POA: Diagnosis not present

## 2022-11-21 DIAGNOSIS — Z8249 Family history of ischemic heart disease and other diseases of the circulatory system: Secondary | ICD-10-CM

## 2022-11-21 DIAGNOSIS — E785 Hyperlipidemia, unspecified: Secondary | ICD-10-CM

## 2022-11-24 NOTE — Telephone Encounter (Signed)
Called patient with Dr. Kyla Balzarine recommendations. Patient stated he was taking cold medication that he thinks made he BP go up. Patient is going to check his BP today and tomorrow to see if it is still elevated. If it is he will message back. Placed order for lab work, to get baseline BMET. Will forward to Dr. Johnsie Cancel for further advisement.

## 2022-12-04 DIAGNOSIS — F4310 Post-traumatic stress disorder, unspecified: Secondary | ICD-10-CM | POA: Diagnosis not present

## 2022-12-05 MED ORDER — LOSARTAN POTASSIUM-HCTZ 100-12.5 MG PO TABS: 1.0000 | ORAL_TABLET | Freq: Every day | ORAL | 3 refills | Status: DC

## 2022-12-11 DIAGNOSIS — F4310 Post-traumatic stress disorder, unspecified: Secondary | ICD-10-CM | POA: Diagnosis not present

## 2022-12-19 DIAGNOSIS — Z8249 Family history of ischemic heart disease and other diseases of the circulatory system: Secondary | ICD-10-CM | POA: Diagnosis not present

## 2022-12-19 DIAGNOSIS — E782 Mixed hyperlipidemia: Secondary | ICD-10-CM | POA: Diagnosis not present

## 2022-12-19 DIAGNOSIS — R7309 Other abnormal glucose: Secondary | ICD-10-CM | POA: Diagnosis not present

## 2022-12-19 DIAGNOSIS — I10 Essential (primary) hypertension: Secondary | ICD-10-CM | POA: Diagnosis not present

## 2022-12-19 DIAGNOSIS — E291 Testicular hypofunction: Secondary | ICD-10-CM | POA: Diagnosis not present

## 2022-12-19 DIAGNOSIS — E559 Vitamin D deficiency, unspecified: Secondary | ICD-10-CM | POA: Diagnosis not present

## 2022-12-19 DIAGNOSIS — R5383 Other fatigue: Secondary | ICD-10-CM | POA: Diagnosis not present

## 2022-12-19 DIAGNOSIS — E785 Hyperlipidemia, unspecified: Secondary | ICD-10-CM | POA: Diagnosis not present

## 2022-12-20 LAB — BASIC METABOLIC PANEL
BUN/Creatinine Ratio: 13 (ref 9–20)
BUN: 14 mg/dL (ref 6–24)
CO2: 27 mmol/L (ref 20–29)
Calcium: 9.3 mg/dL (ref 8.7–10.2)
Chloride: 103 mmol/L (ref 96–106)
Creatinine, Ser: 1.1 mg/dL (ref 0.76–1.27)
Glucose: 100 mg/dL — ABNORMAL HIGH (ref 70–99)
Potassium: 4.5 mmol/L (ref 3.5–5.2)
Sodium: 142 mmol/L (ref 134–144)
eGFR: 80 mL/min/{1.73_m2} (ref >59)

## 2023-01-29 DIAGNOSIS — R5383 Other fatigue: Secondary | ICD-10-CM | POA: Diagnosis not present

## 2023-01-29 DIAGNOSIS — J309 Allergic rhinitis, unspecified: Secondary | ICD-10-CM | POA: Diagnosis not present

## 2023-02-05 DIAGNOSIS — F4310 Post-traumatic stress disorder, unspecified: Secondary | ICD-10-CM | POA: Diagnosis not present

## 2023-02-05 DIAGNOSIS — E041 Nontoxic single thyroid nodule: Secondary | ICD-10-CM | POA: Diagnosis not present

## 2023-03-12 DIAGNOSIS — F4310 Post-traumatic stress disorder, unspecified: Secondary | ICD-10-CM | POA: Diagnosis not present

## 2023-04-17 DIAGNOSIS — D509 Iron deficiency anemia, unspecified: Secondary | ICD-10-CM | POA: Diagnosis not present

## 2023-04-17 DIAGNOSIS — Z8249 Family history of ischemic heart disease and other diseases of the circulatory system: Secondary | ICD-10-CM | POA: Diagnosis not present

## 2023-04-17 DIAGNOSIS — R7309 Other abnormal glucose: Secondary | ICD-10-CM | POA: Diagnosis not present

## 2023-04-17 DIAGNOSIS — I10 Essential (primary) hypertension: Secondary | ICD-10-CM | POA: Diagnosis not present

## 2023-04-17 DIAGNOSIS — E291 Testicular hypofunction: Secondary | ICD-10-CM | POA: Diagnosis not present

## 2023-04-17 DIAGNOSIS — E049 Nontoxic goiter, unspecified: Secondary | ICD-10-CM | POA: Diagnosis not present

## 2023-04-17 DIAGNOSIS — R5383 Other fatigue: Secondary | ICD-10-CM | POA: Diagnosis not present

## 2023-04-17 DIAGNOSIS — E785 Hyperlipidemia, unspecified: Secondary | ICD-10-CM | POA: Diagnosis not present

## 2023-04-17 DIAGNOSIS — R7989 Other specified abnormal findings of blood chemistry: Secondary | ICD-10-CM | POA: Diagnosis not present

## 2023-04-17 LAB — HEMATOCRIT: Hematocrit: 47.9 % (ref 37.5–51.0)

## 2023-04-30 DIAGNOSIS — J309 Allergic rhinitis, unspecified: Secondary | ICD-10-CM | POA: Diagnosis not present

## 2023-04-30 DIAGNOSIS — E291 Testicular hypofunction: Secondary | ICD-10-CM | POA: Diagnosis not present

## 2023-04-30 DIAGNOSIS — R5383 Other fatigue: Secondary | ICD-10-CM | POA: Diagnosis not present

## 2023-05-07 DIAGNOSIS — F4310 Post-traumatic stress disorder, unspecified: Secondary | ICD-10-CM | POA: Diagnosis not present

## 2023-06-04 DIAGNOSIS — F4310 Post-traumatic stress disorder, unspecified: Secondary | ICD-10-CM | POA: Diagnosis not present

## 2023-07-07 DIAGNOSIS — L718 Other rosacea: Secondary | ICD-10-CM | POA: Diagnosis not present

## 2023-07-07 DIAGNOSIS — D235 Other benign neoplasm of skin of trunk: Secondary | ICD-10-CM | POA: Diagnosis not present

## 2023-07-07 DIAGNOSIS — L578 Other skin changes due to chronic exposure to nonionizing radiation: Secondary | ICD-10-CM | POA: Diagnosis not present

## 2023-07-07 DIAGNOSIS — Z7189 Other specified counseling: Secondary | ICD-10-CM | POA: Diagnosis not present

## 2023-07-13 DIAGNOSIS — D509 Iron deficiency anemia, unspecified: Secondary | ICD-10-CM | POA: Diagnosis not present

## 2023-07-13 DIAGNOSIS — R7309 Other abnormal glucose: Secondary | ICD-10-CM | POA: Diagnosis not present

## 2023-07-13 DIAGNOSIS — R5383 Other fatigue: Secondary | ICD-10-CM | POA: Diagnosis not present

## 2023-07-13 DIAGNOSIS — I10 Essential (primary) hypertension: Secondary | ICD-10-CM | POA: Diagnosis not present

## 2023-07-13 DIAGNOSIS — E782 Mixed hyperlipidemia: Secondary | ICD-10-CM | POA: Diagnosis not present

## 2023-08-06 DIAGNOSIS — F4310 Post-traumatic stress disorder, unspecified: Secondary | ICD-10-CM | POA: Diagnosis not present

## 2023-09-08 DIAGNOSIS — R5383 Other fatigue: Secondary | ICD-10-CM | POA: Diagnosis not present

## 2023-09-08 DIAGNOSIS — E291 Testicular hypofunction: Secondary | ICD-10-CM | POA: Diagnosis not present

## 2023-09-08 DIAGNOSIS — J309 Allergic rhinitis, unspecified: Secondary | ICD-10-CM | POA: Diagnosis not present

## 2023-09-17 DIAGNOSIS — F4310 Post-traumatic stress disorder, unspecified: Secondary | ICD-10-CM | POA: Diagnosis not present

## 2023-10-01 NOTE — Progress Notes (Signed)
Cardiology Office Note:    Date:  10/13/2023   ID:  Tramain Farar Petrow, DOB January 09, 1971, MRN [id number]  PCP:  Chari Manning, MD  Cardiologist:  Dr. Charlton Haws     History of Present Illness:     Tanner Harrison is a 52 y.o. male  family history of premature CAD (father with MI late 79s, mom with heart valve replacement in her 38s), hypothyroidism, dyslipidemia. He was evaluated by me  in 2/16. Cardiac CT demonstrated calcium score 0. Updated calcium score 0 2/211/22 also 0  Returns for FU. Doing well. He tries to run 1-2 times a week.  He denies chest pain, dyspnea, syncope, orthopnea, PND, edema.  He snores.  His wife has not noticed any apnea.  He denies daytime hypersomnolence.    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   Discussed his testosterone replacement Hct 50.2   He is seeing a therapist regarding grief from stillborn child with Trisomy 23   Past Medical History:  Diagnosis Date   Headache    hx of as teen   Heart murmur    hx of   Hemorrhoids    Hyperlipidemia    Hypertension    Hypothyroid    Low TSH level    Pneumonia    20 years ago    Past Surgical History:  Procedure Laterality Date   colonocsopy     2003 11/04/17   COLONOSCOPY WITH PROPOFOL N/A 11/04/2017   Procedure: COLONOSCOPY WITH PROPOFOL DIAGNOSTIC;  Surgeon: Romie Levee, MD;  Location: [facility];  Service: Endoscopy;  Laterality: N/A;   HERNIA REPAIR     x2 bil inguinal, and 2007 L inguinal repaired   Left hand cyst removed     MOUTH SURGERY     teeth extraction    Current Medications: Outpatient Medications Prior to Visit  Medication Sig Dispense Refill   albuterol (PROVENTIL HFA;VENTOLIN HFA) 108 (90 BASE) MCG/ACT inhaler Inhale 2 puffs into the lungs every 6 (six) hours as needed for wheezing or shortness of breath.      amoxicillin (AMOXIL) 500 MG tablet amoxicillin 500 mg tablet  TAKE 4 TABLETS BY MOUTH 1 HOUR BEFORE DENTIST OR OTHER PROCEDURES     anastrozole (ARIMIDEX) 1 MG tablet Take 0.5 tablets by mouth once a week. ONLY ON WEDNESDAYS     Ascorbic Acid (VITAMIN C) POWD Take 1,000 mg daily by mouth.     aspirin EC 81 MG tablet Take 81 mg by mouth daily.     atorvastatin (LIPITOR) 20 MG tablet TAKE 1 TABLET(20 MG) BY MOUTH DAILY 90 tablet 3   DHEA 50 MG TABS Take 50 mg daily by mouth.     ferrous gluconate (FERGON) 324 MG tablet Take 324 mg by mouth daily.     hydrocortisone (CORTEF) 10 MG tablet Take 10 mg by mouth daily.     ketotifen (ZADITOR) 0.025 % ophthalmic solution Place 1 drop daily as needed into both eyes (allergies).     losartan-hydrochlorothiazide (HYZAAR) 100-12.5 MG tablet Take 1 tablet by mouth daily. 90 tablet 3   meloxicam (MOBIC) 15 MG tablet Take 15 mg by mouth daily.     omeprazole (PRILOSEC) 40 MG capsule Take 40 mg by mouth every morning.     OVER THE COUNTER MEDICATION Take  1 tablet by mouth daily with lunch. Glucose Optimizer OTC supplement     Pregnenolone POWD pregnenolone (bulk) powder     SYNTHROID 137 MCG tablet Take 137 mcg by mouth every morning.     testosterone cypionate (DEPOTESTOTERONE CYPIONATE) 100 MG/ML injection testosterone cypionate 100 mg/mL intramuscular oil  INJECT 1 ML INTO THE MUSCLE EVERY 12 DAYS     No facility-administered medications prior to visit.     Allergies:   Gluten meal   Social History   Socioeconomic History   Marital status: Married    Spouse name: Not on file   Number of children: Not on file   Years of education: Not on file   Highest education level: Not on file  Occupational History   Occupation: tax Buyer, retail: [employer]  Tobacco Use   Smoking status: Former    Current packs/day: 0.00    Types: Cigarettes    Quit date: 1998    Years since quitting: 26.8   Smokeless tobacco: Never  Vaping Use    Vaping status: Never Used  Substance and Sexual Activity   Alcohol use: No    Alcohol/week: 0.0 standard drinks of alcohol   Drug use: No   Sexual activity: Yes  Other Topics Concern   Not on file  Social History Narrative   Tax Systems developer with [employer]   Married   4 children - 3 living; Daughter married and lives in [place], [place]   Social Determinants of Health   Financial Resource Strain: Not on file  Food Insecurity: Not on file  Transportation Needs: Not on file  Physical Activity: Not on file  Stress: Not on file  Social Connections: Not on file     Family History:  The patient's family history includes Heart Problems (age of onset: 49) in his mother; Heart attack (age of onset: 107) in his father.   ROS:   Please see the history of present illness.    Review of Systems  Respiratory:  Positive for snoring.    All other systems reviewed and are negative.   Physical Exam:    VS:  BP 116/82   Pulse 62   Ht 5\' 6"  (1.676 m)   Wt 227 lb (103 kg)   SpO2 98%   BMI 36.64 kg/m    Affect appropriate Healthy:  appears stated age HEENT: normal Neck supple with no adenopathy JVP normal no bruits no thyromegaly Lungs clear with no wheezing and good diaphragmatic motion Heart:  S1/S2 no murmur, no rub, gallop or click PMI normal Abdomen: benighn, BS positve, no tenderness, no AAA no bruit.  No HSM or HJR Distal pulses intact with no bruits No edema Neuro non-focal Skin warm and dry Post left THR     Wt Readings from Last 3 Encounters:  10/13/23 227 lb (103 kg)  08/13/22 236 lb (107 kg)  08/02/21 225 lb 9.6 oz (102.3 kg)      Studies/Labs Reviewed:     EKG:  2019/02/26 SR rate 70 normal 10/13/2023 NSR rate 73 nonspecific ST changes PVC   Recent Labs: 12/19/2022: BUN 14; Creatinine, Ser 1.10; Potassium 4.5; Sodium 142  Labs 11/14/15 (from PCP): K 4.5, creatinine 0.93, ALT 26, TC 179, HDL 45, LDL 110, Hgb 16.2, TSH 1.617  Recent Lipid Panel     Component Value Date/Time   CHOL 165 08/13/2022 0832   TRIG 135 08/13/2022 0832   HDL 45 08/13/2022 0832   CHOLHDL 3.7  08/13/2022 0832   LDLCALC 96 08/13/2022 0832    Additional studies/ records that were reviewed today include:   Cardiac CT 3/16 IMPRESSION: Coronary calcium score of 0.  Cardiac Calcium Score 02/01/21 Score 0   ASSESSMENT:     Family History CAD/HTN  PLAN:     In order of problems listed above:  1. HTN -  Well controlled.  Continue current medications and low sodium Dash type diet.    2. FHx of CAD - Ca score  02/01/21  still 0 .  No angina.  Continue ASA, statin.      3. HLD   Lipitor 20 mg labs today LFTls normal after ERCP   4. Thyroid: on replacement TSH normal   5. Ortho:  Post left hip replacement improved F/U Dr Sullivan [facility]   6. GI:  Post gallbladder surgery and ERCP x 3   7. Testosterone:  discussed risks of clotting and Hct being too high f/u primary Latest done  down to 47.9 04/17/23   Lipid/liver /Hct  F/U in a year   Charlton Haws

## 2023-10-13 ENCOUNTER — Ambulatory Visit: Payer: Federal, State, Local not specified - PPO | Attending: Cardiovascular Disease | Admitting: Cardiovascular Disease

## 2023-10-13 ENCOUNTER — Encounter: Payer: Self-pay | Admitting: Cardiovascular Disease

## 2023-10-13 VITALS — BP 116/82 | HR 62 | Ht 66.0 in | Wt 227.0 lb

## 2023-10-13 DIAGNOSIS — I10 Essential (primary) hypertension: Secondary | ICD-10-CM

## 2023-10-13 DIAGNOSIS — Z8249 Family history of ischemic heart disease and other diseases of the circulatory system: Secondary | ICD-10-CM

## 2023-10-13 DIAGNOSIS — E349 Endocrine disorder, unspecified: Secondary | ICD-10-CM

## 2023-10-13 DIAGNOSIS — E785 Hyperlipidemia, unspecified: Secondary | ICD-10-CM

## 2023-10-13 NOTE — Patient Instructions (Addendum)
Medication Instructions:  Your physician recommends that you continue on your current medications as directed. Please refer to the Current Medication list given to you today.  *If you need a refill on your cardiac medications before your next appointment, please call your pharmacy*  Lab Work: Your physician recommends that you have lab work today- Hct, lipid and liver panel. If you have labs (blood work) drawn today and your tests are completely normal, you will receive your results only by: MyChart Message (if you have MyChart) OR A paper copy in the mail If you have any lab test that is abnormal or we need to change your treatment, we will call you to review the results.  Follow-Up: At [facility], you and your health needs are our priority.  As part of our continuing mission to provide you with exceptional heart care, we have created designated Provider Care Teams.  These Care Teams include your primary Cardiologist (physician) and Advanced Practice Providers (APPs -  Physician Assistants and Nurse Practitioners) who all work together to provide you with the care you need, when you need it.  We recommend signing up for the patient portal called "MyChart".  Sign up information is provided on this After Visit Summary.  MyChart is used to connect with patients for Virtual Visits (Telemedicine).  Patients are able to view lab/test results, encounter notes, upcoming appointments, etc.  Non-urgent messages can be sent to your provider as well.   To learn more about what you can do with MyChart, go to [email or web address].    Your next appointment:   1 year(s)  Provider:   Charlton Haws, MD

## 2023-10-14 ENCOUNTER — Telehealth: Payer: Self-pay

## 2023-10-14 DIAGNOSIS — I10 Essential (primary) hypertension: Secondary | ICD-10-CM

## 2023-10-14 DIAGNOSIS — E785 Hyperlipidemia, unspecified: Secondary | ICD-10-CM

## 2023-10-14 LAB — HEPATIC FUNCTION PANEL
ALT: 21 [IU]/L (ref 0–44)
AST: 24 [IU]/L (ref 0–40)
Albumin: 4.4 g/dL (ref 3.8–4.9)
Alkaline Phosphatase: 87 [IU]/L (ref 44–121)
Bilirubin Total: 1.5 mg/dL — ABNORMAL HIGH (ref 0.0–1.2)
Bilirubin, Direct: 0.29 mg/dL (ref 0.00–0.40)
Total Protein: 7.2 g/dL (ref 6.0–8.5)

## 2023-10-14 LAB — LIPID PANEL
Chol/HDL Ratio: 3.3 ratio (ref 0.0–5.0)
Cholesterol, Total: 146 mg/dL (ref 100–199)
HDL: 44 mg/dL (ref >39)
LDL Chol Calc (NIH): 83 mg/dL (ref 0–99)
Triglycerides: 102 mg/dL (ref 0–149)
VLDL Cholesterol Cal: 19 mg/dL (ref 5–40)

## 2023-10-14 LAB — HEMATOCRIT: Hematocrit: 52.6 % — ABNORMAL HIGH (ref 37.5–51.0)

## 2023-10-14 NOTE — Telephone Encounter (Signed)
-----   Message from Charlton Haws sent at 10/14/2023  8:49 AM EDT ----- Hct > 50 I have talked to him about stroke risk with too much testosterone He needs to cut this back and talk to primary about this LDL above 70 Repeat calcium score if still 0 can stay on current dose of lipitor If none 0 consider increasing to 40 mg daily

## 2023-10-14 NOTE — Telephone Encounter (Signed)
Cindi Carbon [place], RN 10/14/2023  5:01 PM EDT Back to Top    The patient has been notified of the result and verbalized understanding.  All questions (if any) were answered. Cindi Carbon [place], RN 10/14/2023 4:57 PM   Patient would like to know if he should stop his Iron tablet 324 mg daily, and would stopping his Iron, would that help the hematocrit?   Will send copy to patient's PCP and will place order for CT calcium score.

## 2023-10-15 DIAGNOSIS — F4310 Post-traumatic stress disorder, unspecified: Secondary | ICD-10-CM | POA: Diagnosis not present

## 2023-10-30 ENCOUNTER — Ambulatory Visit (HOSPITAL_COMMUNITY)
Admission: RE | Admit: 2023-10-30 | Discharge: 2023-10-30 | Disposition: A | Discharge status: Home / Self Care | Payer: Federal, State, Local not specified - PPO | Source: Ambulatory Visit | Attending: Cardiovascular Disease | Admitting: Cardiovascular Disease

## 2023-10-30 DIAGNOSIS — I10 Essential (primary) hypertension: Secondary | ICD-10-CM | POA: Insufficient documentation

## 2023-10-30 DIAGNOSIS — E785 Hyperlipidemia, unspecified: Secondary | ICD-10-CM | POA: Insufficient documentation

## 2023-11-01 ENCOUNTER — Other Ambulatory Visit: Payer: Self-pay | Admitting: Cardiovascular Disease

## 2023-11-12 ENCOUNTER — Other Ambulatory Visit: Payer: Self-pay | Admitting: Cardiovascular Disease

## 2023-11-12 DIAGNOSIS — F4310 Post-traumatic stress disorder, unspecified: Secondary | ICD-10-CM | POA: Diagnosis not present

## 2023-11-20 DIAGNOSIS — F4310 Post-traumatic stress disorder, unspecified: Secondary | ICD-10-CM | POA: Diagnosis not present

## 2024-01-25 DIAGNOSIS — N5203 Combined arterial insufficiency and corporo-venous occlusive erectile dysfunction: Secondary | ICD-10-CM | POA: Diagnosis not present

## 2024-02-04 DIAGNOSIS — F4310 Post-traumatic stress disorder, unspecified: Secondary | ICD-10-CM | POA: Diagnosis not present

## 2024-02-10 DIAGNOSIS — D509 Iron deficiency anemia, unspecified: Secondary | ICD-10-CM | POA: Diagnosis not present

## 2024-02-10 DIAGNOSIS — R5383 Other fatigue: Secondary | ICD-10-CM | POA: Diagnosis not present

## 2024-02-10 DIAGNOSIS — D649 Anemia, unspecified: Secondary | ICD-10-CM | POA: Diagnosis not present

## 2024-03-02 DIAGNOSIS — E041 Nontoxic single thyroid nodule: Secondary | ICD-10-CM | POA: Diagnosis not present

## 2024-03-02 DIAGNOSIS — E039 Hypothyroidism, unspecified: Secondary | ICD-10-CM | POA: Diagnosis not present

## 2024-07-12 DIAGNOSIS — E039 Hypothyroidism, unspecified: Secondary | ICD-10-CM | POA: Diagnosis not present

## 2024-07-12 DIAGNOSIS — D509 Iron deficiency anemia, unspecified: Secondary | ICD-10-CM | POA: Diagnosis not present

## 2024-07-12 DIAGNOSIS — E291 Testicular hypofunction: Secondary | ICD-10-CM | POA: Diagnosis not present

## 2024-07-12 DIAGNOSIS — Z79899 Other long term (current) drug therapy: Secondary | ICD-10-CM | POA: Diagnosis not present

## 2024-07-12 DIAGNOSIS — E559 Vitamin D deficiency, unspecified: Secondary | ICD-10-CM | POA: Diagnosis not present

## 2024-07-12 DIAGNOSIS — E782 Mixed hyperlipidemia: Secondary | ICD-10-CM | POA: Diagnosis not present

## 2024-07-12 DIAGNOSIS — I10 Essential (primary) hypertension: Secondary | ICD-10-CM | POA: Diagnosis not present

## 2024-07-12 DIAGNOSIS — R7989 Other specified abnormal findings of blood chemistry: Secondary | ICD-10-CM | POA: Diagnosis not present

## 2024-08-29 ENCOUNTER — Other Ambulatory Visit: Payer: Self-pay | Admitting: Cardiovascular Disease

## 2024-11-01 ENCOUNTER — Other Ambulatory Visit: Payer: Self-pay | Admitting: Cardiovascular Disease

## 2024-11-02 DIAGNOSIS — Z79899 Other long term (current) drug therapy: Secondary | ICD-10-CM | POA: Diagnosis not present

## 2024-11-02 DIAGNOSIS — R7989 Other specified abnormal findings of blood chemistry: Secondary | ICD-10-CM | POA: Diagnosis not present

## 2024-11-02 DIAGNOSIS — R5383 Other fatigue: Secondary | ICD-10-CM | POA: Diagnosis not present

## 2024-11-02 DIAGNOSIS — E782 Mixed hyperlipidemia: Secondary | ICD-10-CM | POA: Diagnosis not present

## 2024-11-02 DIAGNOSIS — I10 Essential (primary) hypertension: Secondary | ICD-10-CM | POA: Diagnosis not present

## 2024-11-03 MED ORDER — ATORVASTATIN CALCIUM 20 MG PO TABS: ORAL_TABLET | ORAL | 0 refills | Status: DC

## 2024-11-23 ENCOUNTER — Encounter: Payer: Self-pay | Admitting: Cardiovascular Disease

## 2024-11-23 MED ORDER — LOSARTAN POTASSIUM-HCTZ 100-12.5 MG PO TABS: 1.0000 | ORAL_TABLET | Freq: Every day | ORAL | 0 refills | Status: DC

## 2024-11-26 DIAGNOSIS — Z91018 Allergy to other foods: Secondary | ICD-10-CM | POA: Diagnosis not present

## 2024-11-26 DIAGNOSIS — E041 Nontoxic single thyroid nodule: Secondary | ICD-10-CM | POA: Diagnosis not present

## 2024-11-26 DIAGNOSIS — Z79899 Other long term (current) drug therapy: Secondary | ICD-10-CM | POA: Diagnosis not present

## 2024-11-26 DIAGNOSIS — Z7982 Long term (current) use of aspirin: Secondary | ICD-10-CM | POA: Diagnosis not present

## 2024-11-26 DIAGNOSIS — M79641 Pain in right hand: Secondary | ICD-10-CM | POA: Diagnosis not present

## 2024-11-26 DIAGNOSIS — Y92410 Unspecified street and highway as the place of occurrence of the external cause: Secondary | ICD-10-CM | POA: Diagnosis not present

## 2024-11-26 DIAGNOSIS — E7800 Pure hypercholesterolemia, unspecified: Secondary | ICD-10-CM | POA: Diagnosis not present

## 2024-11-26 DIAGNOSIS — Z79890 Hormone replacement therapy (postmenopausal): Secondary | ICD-10-CM | POA: Diagnosis not present

## 2024-11-26 DIAGNOSIS — I10 Essential (primary) hypertension: Secondary | ICD-10-CM | POA: Diagnosis not present

## 2024-11-26 DIAGNOSIS — S60221A Contusion of right hand, initial encounter: Secondary | ICD-10-CM | POA: Diagnosis not present

## 2024-11-26 DIAGNOSIS — Z7952 Long term (current) use of systemic steroids: Secondary | ICD-10-CM | POA: Diagnosis not present

## 2024-11-26 DIAGNOSIS — M25552 Pain in left hip: Secondary | ICD-10-CM | POA: Diagnosis not present

## 2024-11-26 DIAGNOSIS — Z96642 Presence of left artificial hip joint: Secondary | ICD-10-CM | POA: Diagnosis not present

## 2024-11-26 DIAGNOSIS — R079 Chest pain, unspecified: Secondary | ICD-10-CM | POA: Diagnosis not present

## 2024-11-26 DIAGNOSIS — Z87891 Personal history of nicotine dependence: Secondary | ICD-10-CM | POA: Diagnosis not present

## 2024-11-26 DIAGNOSIS — K449 Diaphragmatic hernia without obstruction or gangrene: Secondary | ICD-10-CM | POA: Diagnosis not present

## 2024-11-26 DIAGNOSIS — S20219A Contusion of unspecified front wall of thorax, initial encounter: Secondary | ICD-10-CM | POA: Diagnosis not present

## 2024-12-05 ENCOUNTER — Other Ambulatory Visit: Payer: Self-pay | Admitting: Cardiovascular Disease

## 2025-01-02 ENCOUNTER — Other Ambulatory Visit: Payer: Self-pay | Admitting: Cardiovascular Disease
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
## 2025-01-13 ENCOUNTER — Ambulatory Visit: Attending: Cardiovascular Disease | Admitting: Cardiovascular Disease

## 2025-01-13 ENCOUNTER — Encounter: Payer: Self-pay | Admitting: Cardiovascular Disease

## 2025-01-13 VITALS — BP 140/86 | HR 81 | Ht 66.0 in | Wt 237.0 lb

## 2025-01-13 DIAGNOSIS — E785 Hyperlipidemia, unspecified: Secondary | ICD-10-CM | POA: Diagnosis not present

## 2025-01-13 DIAGNOSIS — I10 Essential (primary) hypertension: Secondary | ICD-10-CM

## 2025-01-13 DIAGNOSIS — E349 Endocrine disorder, unspecified: Secondary | ICD-10-CM | POA: Diagnosis not present

## 2025-01-13 DIAGNOSIS — Z136 Encounter for screening for cardiovascular disorders: Secondary | ICD-10-CM

## 2025-01-13 MED ORDER — ATORVASTATIN CALCIUM 20 MG PO TABS: 10.0000 mg | ORAL_TABLET | Freq: Every day | ORAL | 3 refills | Status: DC

## 2025-01-13 MED ORDER — LOSARTAN POTASSIUM-HCTZ 100-12.5 MG PO TABS: 1.0000 | ORAL_TABLET | Freq: Every day | ORAL | 3 refills | Status: AC

## 2025-01-13 MED ORDER — ATORVASTATIN CALCIUM 20 MG PO TABS: 20.0000 mg | ORAL_TABLET | Freq: Every day | ORAL | 3 refills | Status: AC

## 2025-01-13 NOTE — Patient Instructions (Signed)
 Medication Instructions:  Your physician recommends that you continue on your current medications as directed. Please refer to the Current Medication list given to you today.  *If you need a refill on your cardiac medications before your next appointment, please call your pharmacy*  Lab Work: NONE ordered at this time of appointment   Testing/Procedures: NONE ordered at this time of appointment   Follow-Up: At [facility], you and your health needs are our priority.  As part of our continuing mission to provide you with exceptional heart care, our providers are all part of one team.  This team includes your primary Cardiologist (physician) and Advanced Practice Providers or APPs (Physician Assistants and Nurse Practitioners) who all work together to provide you with the care you need, when you need it.  Your next appointment:   1 year(s)  Provider:   Maude Emmer, MD    We recommend signing up for the patient portal called MyChart.  Sign up information is provided on this After Visit Summary.  MyChart is used to connect with patients for Virtual Visits (Telemedicine).  Patients are able to view lab/test results, encounter notes, upcoming appointments, etc.  Non-urgent messages can be sent to your provider as well.   To learn more about what you can do with MyChart, go to [email or web address].

## 2025-01-17 ENCOUNTER — Other Ambulatory Visit: Payer: Self-pay | Admitting: Cardiovascular Disease

## 2025-01-20 ENCOUNTER — Ambulatory Visit: Admitting: Cardiovascular Disease
